# Patient Record
Sex: Female | Born: 1975
Health system: Southern US, Community
[De-identification: ages and names within clinical notes are randomized; demographics above are authoritative.]

## PROBLEM LIST (undated history)

## (undated) DIAGNOSIS — Q859 Phakomatosis, unspecified: Secondary | ICD-10-CM

## (undated) DIAGNOSIS — O09529 Supervision of elderly multigravida, unspecified trimester: Secondary | ICD-10-CM

## (undated) DIAGNOSIS — Z3490 Encounter for supervision of normal pregnancy, unspecified, unspecified trimester: Secondary | ICD-10-CM

## (undated) HISTORY — DX: Phakomatosis, unspecified: Q85.9

## (undated) HISTORY — PX: LEEP: SHX91

## (undated) HISTORY — DX: Supervision of elderly multigravida, unspecified trimester: O09.529

## (undated) HISTORY — PX: TONSILLECTOMY AND ADENOIDECTOMY: SUR1326

## (undated) HISTORY — DX: Encounter for supervision of normal pregnancy, unspecified, unspecified trimester: Z34.90

## (undated) HISTORY — PX: MOHS SURGERY: SUR867

## (undated) HISTORY — PX: OTHER SURGICAL HISTORY: SHX169

---

## 2008-12-04 ENCOUNTER — Ambulatory Visit: Payer: Self-pay | Admitting: Cardiology

## 2014-04-03 DIAGNOSIS — C4490 Unspecified malignant neoplasm of skin, unspecified: Secondary | ICD-10-CM

## 2014-04-03 HISTORY — DX: Unspecified malignant neoplasm of skin, unspecified: C44.90

## 2014-07-03 ENCOUNTER — Ambulatory Visit
Admission: RE | Admit: 2014-07-03 | Discharge: 2014-07-03 | Disposition: A | Discharge status: Home / Self Care | Payer: 59 | Source: Ambulatory Visit | Attending: Unknown Physician Specialty | Admitting: Unknown Physician Specialty

## 2014-07-03 ENCOUNTER — Other Ambulatory Visit: Payer: Self-pay | Admitting: Unknown Physician Specialty

## 2014-07-03 DIAGNOSIS — N643 Galactorrhea not associated with childbirth: Secondary | ICD-10-CM

## 2014-12-10 ENCOUNTER — Other Ambulatory Visit: Payer: Self-pay | Admitting: Family Medicine

## 2014-12-10 DIAGNOSIS — R5381 Other malaise: Secondary | ICD-10-CM

## 2015-05-03 DIAGNOSIS — N644 Mastodynia: Secondary | ICD-10-CM | POA: Diagnosis not present

## 2015-06-02 DIAGNOSIS — L820 Inflamed seborrheic keratosis: Secondary | ICD-10-CM | POA: Diagnosis not present

## 2015-06-02 DIAGNOSIS — Z08 Encounter for follow-up examination after completed treatment for malignant neoplasm: Secondary | ICD-10-CM | POA: Diagnosis not present

## 2015-06-02 DIAGNOSIS — Z85828 Personal history of other malignant neoplasm of skin: Secondary | ICD-10-CM | POA: Diagnosis not present

## 2015-06-08 DIAGNOSIS — D2312 Other benign neoplasm of skin of left eyelid, including canthus: Secondary | ICD-10-CM | POA: Diagnosis not present

## 2015-06-29 DIAGNOSIS — H524 Presbyopia: Secondary | ICD-10-CM | POA: Diagnosis not present

## 2015-08-24 DIAGNOSIS — S46912A Strain of unspecified muscle, fascia and tendon at shoulder and upper arm level, left arm, initial encounter: Secondary | ICD-10-CM | POA: Diagnosis not present

## 2015-09-08 MED FILL — PROGESTERONE 200 MG CAPSULE: 200 | 30 days supply | Qty: 30 | Fill #0

## 2015-09-10 ENCOUNTER — Encounter: Payer: Self-pay | Admitting: Adult Health

## 2015-09-10 ENCOUNTER — Ambulatory Visit (INDEPENDENT_AMBULATORY_CARE_PROVIDER_SITE_OTHER): Payer: 59 | Admitting: Adult Health

## 2015-09-10 VITALS — BP 130/70 | HR 92 | Ht 68.5 in | Wt 140.0 lb

## 2015-09-10 DIAGNOSIS — O3680X0 Pregnancy with inconclusive fetal viability, not applicable or unspecified: Secondary | ICD-10-CM

## 2015-09-10 DIAGNOSIS — Z3201 Encounter for pregnancy test, result positive: Secondary | ICD-10-CM | POA: Diagnosis not present

## 2015-09-10 DIAGNOSIS — M545 Low back pain: Secondary | ICD-10-CM

## 2015-09-10 DIAGNOSIS — R1030 Lower abdominal pain, unspecified: Secondary | ICD-10-CM

## 2015-09-10 DIAGNOSIS — Z3490 Encounter for supervision of normal pregnancy, unspecified, unspecified trimester: Secondary | ICD-10-CM

## 2015-09-10 DIAGNOSIS — O09521 Supervision of elderly multigravida, first trimester: Secondary | ICD-10-CM

## 2015-09-10 DIAGNOSIS — O09529 Supervision of elderly multigravida, unspecified trimester: Secondary | ICD-10-CM

## 2015-09-10 HISTORY — DX: Encounter for supervision of normal pregnancy, unspecified, unspecified trimester: Z34.90

## 2015-09-10 HISTORY — DX: Supervision of elderly multigravida, unspecified trimester: O09.529

## 2015-09-10 LAB — POCT URINE PREGNANCY: PREG TEST UR: POSITIVE — AB

## 2015-09-10 NOTE — Progress Notes (Signed)
Subjective:     Patient ID: Karina Jones, female   DOB: [birth year], 40 y.o.   MRN: [id number]  HPI Karina Jones is a 40 year old white female, in for UPT, she has had 3+HPT and has some pain RUQ at rib and into back that comes and goes at times. She is taking prometrium 200 mg and 81 mg ASA and prenatal vitamins by Dr Barrie Dunker in [place], but with her insurance is wants to deliver at [facility], she works in [employer] at [employer].   Review of Systems Patient denies any headaches, hearing loss, fatigue, blurred vision, shortness of breath, chest pain, problems with bowel movements, urination, or intercourse. No joint pain or mood swings.See HPI for positves.  Reviewed past medical,surgical, social and family history. Reviewed medications and allergies.     Objective:   Physical Exam BP 130/70 mmHg  Pulse 92  Ht 5' 8.5" (1.74 m)  Wt 140 lb (63.504 kg)  BMI 20.98 kg/m2  LMP 08/04/2015 UPT + about 5+ 2 weeks by LMP with EDD 05/10/16, Skin warm and dry. Neck: mid line trachea, normal thyroid, good ROM, no lymphadenopathy noted. Lungs: clear to ausculation bilaterally. Cardiovascular: regular rate and rhythm. Abdomen is soft and non tender.     Assessment:      UPT + Pregnant AMA    Plan:     Return 6/20 for dating [place] Review handout on first trimester

## 2015-09-10 NOTE — Patient Instructions (Signed)
First Trimester of Pregnancy The first trimester of pregnancy is from week 1 until the end of week 12 (months 1 through 3). A week after a sperm fertilizes an egg, the egg will implant on the wall of the uterus. This embryo will begin to develop into a baby. Genes from you and your partner are forming the baby. The female genes determine whether the baby is a boy or a girl. At 6-8 weeks, the eyes and face are formed, and the heartbeat can be seen on ultrasound. At the end of 12 weeks, all the baby's organs are formed.  Now that you are pregnant, you will want to do everything you can to have a healthy baby. Two of the most important things are to get good prenatal care and to follow your health care provider's instructions. Prenatal care is all the medical care you receive before the baby's birth. This care will help prevent, find, and treat any problems during the pregnancy and childbirth. BODY CHANGES Your body goes through many changes during pregnancy. The changes vary from woman to woman.   You may gain or lose a couple of pounds at first.  You may feel sick to your stomach (nauseous) and throw up (vomit). If the vomiting is uncontrollable, call your health care provider.  You may tire easily.  You may develop headaches that can be relieved by medicines approved by your health care provider.  You may urinate more often. Painful urination may mean you have a bladder infection.  You may develop heartburn as a result of your pregnancy.  You may develop constipation because certain hormones are causing the muscles that push waste through your intestines to slow down.  You may develop hemorrhoids or swollen, bulging veins (varicose veins).  Your breasts may begin to grow larger and become tender. Your nipples may stick out more, and the tissue that surrounds them (areola) may become darker.  Your gums may bleed and may be sensitive to brushing and flossing.  Dark spots or blotches (chloasma,  mask of pregnancy) may develop on your face. This will likely fade after the baby is born.  Your menstrual periods will stop.  You may have a loss of appetite.  You may develop cravings for certain kinds of food.  You may have changes in your emotions from day to day, such as being excited to be pregnant or being concerned that something may go wrong with the pregnancy and baby.  You may have more vivid and strange dreams.  You may have changes in your hair. These can include thickening of your hair, rapid growth, and changes in texture. Some women also have hair loss during or after pregnancy, or hair that feels dry or thin. Your hair will most likely return to normal after your baby is born. WHAT TO EXPECT AT YOUR PRENATAL VISITS During a routine prenatal visit:  You will be weighed to make sure you and the baby are growing normally.  Your blood pressure will be taken.  Your abdomen will be measured to track your baby's growth.  The fetal heartbeat will be listened to starting around week 10 or 12 of your pregnancy.  Test results from any previous visits will be discussed. Your health care provider may ask you:  How you are feeling.  If you are feeling the baby move.  If you have had any abnormal symptoms, such as leaking fluid, bleeding, severe headaches, or abdominal cramping.  If you are using any tobacco products,   including cigarettes, chewing tobacco, and electronic cigarettes.  If you have any questions. Other tests that may be performed during your first trimester include:  Blood tests to find your blood type and to check for the presence of any previous infections. They will also be used to check for low iron levels (anemia) and Rh antibodies. Later in the pregnancy, blood tests for diabetes will be done along with other tests if problems develop.  Urine tests to check for infections, diabetes, or protein in the urine.  An ultrasound to confirm the proper growth  and development of the baby.  An amniocentesis to check for possible genetic problems.  Fetal screens for spina bifida and Down syndrome.  You may need other tests to make sure you and the baby are doing well.  HIV (human immunodeficiency virus) testing. Routine prenatal testing includes screening for HIV, unless you choose not to have this test. HOME CARE INSTRUCTIONS  Medicines  Follow your health care provider's instructions regarding medicine use. Specific medicines may be either safe or unsafe to take during pregnancy.  Take your prenatal vitamins as directed.  If you develop constipation, try taking a stool softener if your health care provider approves. Diet  Eat regular, well-balanced meals. Choose a variety of foods, such as meat or vegetable-based protein, fish, milk and low-fat dairy products, vegetables, fruits, and whole grain breads and cereals. Your health care provider will help you determine the amount of weight gain that is right for you.  Avoid raw meat and uncooked cheese. These carry germs that can cause birth defects in the baby.  Eating four or five small meals rather than three large meals a day may help relieve nausea and vomiting. If you start to feel nauseous, eating a few soda crackers can be helpful. Drinking liquids between meals instead of during meals also seems to help nausea and vomiting.  If you develop constipation, eat more high-fiber foods, such as fresh vegetables or fruit and whole grains. Drink enough fluids to keep your urine clear or pale yellow. Activity and Exercise  Exercise only as directed by your health care provider. Exercising will help you:  Control your weight.  Stay in shape.  Be prepared for labor and delivery.  Experiencing pain or cramping in the lower abdomen or low back is a good sign that you should stop exercising. Check with your health care provider before continuing normal exercises.  Try to avoid standing for long  periods of time. Move your legs often if you must stand in one place for a long time.  Avoid heavy lifting.  Wear low-heeled shoes, and practice good posture.  You may continue to have sex unless your health care provider directs you otherwise. Relief of Pain or Discomfort  Wear a good support bra for breast tenderness.   Take warm sitz baths to soothe any pain or discomfort caused by hemorrhoids. Use hemorrhoid cream if your health care provider approves.   Rest with your legs elevated if you have leg cramps or low back pain.  If you develop varicose veins in your legs, wear support hose. Elevate your feet for 15 minutes, 3-4 times a day. Limit salt in your diet. Prenatal Care  Schedule your prenatal visits by the twelfth week of pregnancy. They are usually scheduled monthly at first, then more often in the last 2 months before delivery.  Write down your questions. Take them to your prenatal visits.  Keep all your prenatal visits as directed by your   health care provider. Safety  Wear your seat belt at all times when driving.  Make a list of emergency phone numbers, including numbers for family, friends, the hospital, and police and fire departments. General Tips  Ask your health care provider for a referral to a local prenatal education class. Begin classes no later than at the beginning of month 6 of your pregnancy.  Ask for help if you have counseling or nutritional needs during pregnancy. Your health care provider can offer advice or refer you to specialists for help with various needs.  Do not use hot tubs, steam rooms, or saunas.  Do not douche or use tampons or scented sanitary pads.  Do not cross your legs for long periods of time.  Avoid cat litter boxes and soil used by cats. These carry germs that can cause birth defects in the baby and possibly loss of the fetus by miscarriage or stillbirth.  Avoid all smoking, herbs, alcohol, and medicines not prescribed by  your health care provider. Chemicals in these affect the formation and growth of the baby.  Do not use any tobacco products, including cigarettes, chewing tobacco, and electronic cigarettes. If you need help quitting, ask your health care provider. You may receive counseling support and other resources to help you quit.  Schedule a dentist appointment. At home, brush your teeth with a soft toothbrush and be gentle when you floss. SEEK MEDICAL CARE IF:   You have dizziness.  You have mild pelvic cramps, pelvic pressure, or nagging pain in the abdominal area.  You have persistent nausea, vomiting, or diarrhea.  You have a bad smelling vaginal discharge.  You have pain with urination.  You notice increased swelling in your face, hands, legs, or ankles. SEEK IMMEDIATE MEDICAL CARE IF:   You have a fever.  You are leaking fluid from your vagina.  You have spotting or bleeding from your vagina.  You have severe abdominal cramping or pain.  You have rapid weight gain or loss.  You vomit blood or material that looks like coffee grounds.  You are exposed to [place] measles and have never had them.  You are exposed to fifth disease or chickenpox.  You develop a severe headache.  You have shortness of breath.  You have any kind of trauma, such as from a fall or a car accident.   This information is not intended to replace advice given to you by your health care provider. Make sure you discuss any questions you have with your health care provider.   Document Released: 03/14/2001 Document Revised: 04/10/2014 Document Reviewed: 01/28/2013 Elsevier Interactive Patient Education [employer]. Return in 6/20 for dating [place]

## 2015-09-17 ENCOUNTER — Telehealth: Payer: Self-pay | Admitting: *Deleted

## 2015-09-17 NOTE — Telephone Encounter (Signed)
Pt c/o nausea, feels like the prenatal vitamins are making her sick. Informed pt she can take two Flintstone vitamins a day since not able to tolerate. Pt states concerned because of her age and the Flintstone vitamins not having the folic acid and her age. Informed pt can see if med for nausea and vomiting would help. Offered to route message to provider for RX. Pt states she was going to try the Flinstone vitamins if no better by Monday will call our office back for Rx for the nausea.

## 2015-09-20 ENCOUNTER — Telehealth: Payer: Self-pay | Admitting: Women's Health

## 2015-09-20 ENCOUNTER — Telehealth: Payer: Self-pay | Admitting: *Deleted

## 2015-09-20 MED ORDER — DOXYLAMINE-PYRIDOXINE 10-10 MG PO TBEC: DELAYED_RELEASE_TABLET | ORAL | Status: DC

## 2015-09-20 NOTE — Telephone Encounter (Signed)
Pharmacy states someone has already answered the question they had.

## 2015-09-20 NOTE — Telephone Encounter (Signed)
Spoke with pt. Pt is requesting something for nausea. Not Zofran. Also, this is pt's first time coming here. Her youngest child is 29 and she has had 2 miscarriages before this pregnancy. Her doc in [place] put her on Progesterone. She is coming here now because she works at [employer].  Pt don't want to take this if she don't have to. Should  she have progesterone labs drawn? Please advise. Thanks!! [place]

## 2015-09-20 NOTE — Telephone Encounter (Signed)
Spoke with pt letting her know Diclegis was sent to pharmacy. Pt has already picked it up. Pt has an appt Thursday for [place] and wants to have progesterone drawn then. Pt to be added to lab schedule for Thursday. [place]

## 2015-09-23 ENCOUNTER — Other Ambulatory Visit: Payer: 59

## 2015-09-23 ENCOUNTER — Ambulatory Visit (INDEPENDENT_AMBULATORY_CARE_PROVIDER_SITE_OTHER): Payer: 59

## 2015-09-23 DIAGNOSIS — O3680X0 Pregnancy with inconclusive fetal viability, not applicable or unspecified: Secondary | ICD-10-CM

## 2015-09-23 DIAGNOSIS — Z3A08 8 weeks gestation of pregnancy: Secondary | ICD-10-CM

## 2015-09-23 DIAGNOSIS — Z331 Pregnant state, incidental: Secondary | ICD-10-CM | POA: Diagnosis not present

## 2015-09-23 NOTE — Progress Notes (Signed)
[place] 7+1 wks,single IUP, pos fht 143 bpm,normal lt ov,simple rt corpus luteal cyst 2.6 x 2 x 2.6 cm,crl 9.5 mm

## 2015-09-24 ENCOUNTER — Telehealth: Payer: Self-pay | Admitting: Adult Health

## 2015-09-24 LAB — PROGESTERONE: PROGESTERONE: 17 ng/mL

## 2015-09-24 NOTE — Telephone Encounter (Signed)
Pt aware of 17 progesterone level

## 2015-09-29 MED FILL — PROGESTERONE 200 MG CAPSULE: 200 | 30 days supply | Qty: 30 | Fill #1

## 2015-10-11 ENCOUNTER — Ambulatory Visit (INDEPENDENT_AMBULATORY_CARE_PROVIDER_SITE_OTHER): Payer: 59 | Admitting: Women's Health

## 2015-10-11 ENCOUNTER — Encounter: Payer: Self-pay | Admitting: Women's Health

## 2015-10-11 VITALS — BP 106/68 | HR 68 | Wt 144.0 lb

## 2015-10-11 DIAGNOSIS — Z36 Encounter for antenatal screening of mother: Secondary | ICD-10-CM | POA: Diagnosis not present

## 2015-10-11 DIAGNOSIS — Z3481 Encounter for supervision of other normal pregnancy, first trimester: Secondary | ICD-10-CM

## 2015-10-11 DIAGNOSIS — Z9889 Other specified postprocedural states: Secondary | ICD-10-CM

## 2015-10-11 DIAGNOSIS — Z0189 Encounter for other specified special examinations: Secondary | ICD-10-CM | POA: Diagnosis not present

## 2015-10-11 DIAGNOSIS — Z1389 Encounter for screening for other disorder: Secondary | ICD-10-CM | POA: Diagnosis not present

## 2015-10-11 DIAGNOSIS — Z369 Encounter for antenatal screening, unspecified: Secondary | ICD-10-CM

## 2015-10-11 DIAGNOSIS — Z3A10 10 weeks gestation of pregnancy: Secondary | ICD-10-CM

## 2015-10-11 DIAGNOSIS — Z8759 Personal history of other complications of pregnancy, childbirth and the puerperium: Secondary | ICD-10-CM

## 2015-10-11 DIAGNOSIS — Z3491 Encounter for supervision of normal pregnancy, unspecified, first trimester: Secondary | ICD-10-CM

## 2015-10-11 DIAGNOSIS — Z3490 Encounter for supervision of normal pregnancy, unspecified, unspecified trimester: Secondary | ICD-10-CM | POA: Insufficient documentation

## 2015-10-11 DIAGNOSIS — Z331 Pregnant state, incidental: Secondary | ICD-10-CM

## 2015-10-11 DIAGNOSIS — Z0283 Encounter for blood-alcohol and blood-drug test: Secondary | ICD-10-CM

## 2015-10-11 DIAGNOSIS — O210 Mild hyperemesis gravidarum: Secondary | ICD-10-CM

## 2015-10-11 NOTE — Progress Notes (Signed)
Subjective:  Karina Jones is a 40 y.o. [phone] Caucasian female at [redacted]w[redacted]d by LMP c/w 6wk u/ being seen today for her first obstetrical visit.  Her obstetrical history is significant for h/o SAB x3, term uncomplicated svb x2 w/ last infant weighing 9lb10oz w/o GDM or shoulder dystocia, h/o LEEP, AMA. Started care w/ Dr. Barrie Dunker in [place], but [employer] employee, so insurance making her deliver at [facility]. Dr. Barrie Dunker had started her on vaginal progesterone and baby asa daily d/t h/o 3 SABs. Had progesterone drawn here and was 17, but that was on prometrium. Pregnancy history fully reviewed. Has breast hamartoma- mammograms/us has been neg, she gets checked w/ Dr. Barrie Dunker q 43mths- has appt at beginning of Aug  Patient reports nausea- taking 2 diclegis at night- tried adding one in am but didn't noticed any difference so quit taking that one. Declines additional meds at this time. Fatigue. Denies vb, cramping, uti s/s, abnormal/malodorous vag d/c, or vulvovaginal itching/irritation.  BP 106/68 mmHg  Pulse 68  Wt 144 lb (65.318 kg)  LMP 08/04/2015  HISTORY: OB History  Gravida Para Term Preterm AB SAB TAB Ectopic Multiple Living  6 2 2  3 3    2     # Outcome Date GA Lbr Len/2nd Weight Sex Delivery Anes PTL Lv  6 Current           5 Term 04/12/07 [redacted]w[redacted]d  9 lb 10 oz (4.366 kg) F Vag-Spont EPI N Y  4 Term 11/17/02 [redacted]w[redacted]d  8 lb 6 oz (3.799 kg) M Vag-Spont EPI N Y  3 SAB           2 SAB           1 SAB              Past Medical History  Diagnosis Date  . Hamartoma ([place])     right breast  . Fibroid     breasts  . Pregnant 09/10/2015  . AMA (advanced maternal age) multigravida 35+ 09/10/2015   Past Surgical History  Procedure Laterality Date  . Tonsillectomy and adenoidectomy    . Tubes in ears    . Leep    . Mohs surgery      face  . Adnoids     Family History  Problem Relation Age of Onset  . Atrial fibrillation Mother   . Diabetes Father   . Cerebral palsy Sister   . Hypertension Brother    . Asthma Son   . Cancer Maternal Grandmother     breast  . Other Maternal Grandmother     abdominal aortic aneursym  . Stroke Maternal Grandmother   . Diabetes Maternal Grandfather   . Heart disease Maternal Grandfather   . Hypertension Maternal Grandfather     Exam   System:     General: Well developed & nourished, no acute distress   Skin: Warm & dry, normal coloration and turgor, no rashes   Neurologic: Alert & oriented, normal mood   Cardiovascular: Regular rate & rhythm   Respiratory: Effort & rate normal, LCTAB, acyanotic   Abdomen: Soft, non tender   Extremities: normal strength, tone  Thin prep pap smear neg 2016 in [place]  FHR: 150s via informal transabdominal u/s   Assessment:   Pregnancy: [id number] Patient Active Problem List   Diagnosis Date Noted  . Supervision of normal pregnancy 10/11/2015    Priority: High  . AMA (advanced maternal age) multigravida 35+ 09/10/2015    [redacted]w[redacted]d [id number] New  OB visit H/O macrosomic infant w/o GDM/shoulder dystocia H/O LEEP H/O SAB x 3 Nausea of pregnancy AMA Breast hamartoma  Plan:  Initial labs drawn Continue prenatal vitamins Problem list reviewed and updated Reviewed n/v relief measures and warning s/s to report Reviewed recommended weight gain based on pre-gravid BMI Encouraged well-balanced diet Genetic Screening discussed Integrated Screen: discussed, does think she wants to do, concerned about false pos, discussed option of cfDNA d/t being >35yo- will check w/ insurance Cystic fibrosis screening discussed declined Ultrasound discussed; fetal survey: requested Follow up in 2 weeks for 1st it/nt and visit [place] completed Discussed baby asa w/ LHE- to continue until 14wks then stop To continue prometrium until 14wks  Tawnya Crook CNM, [facility] 10/11/2015 2:39 PM

## 2015-10-11 NOTE — Patient Instructions (Addendum)
Informaseq (cell free DNA testing)  Nausea & Vomiting  Have saltine crackers or pretzels by your bed and eat a few bites before you raise your head out of bed in the morning  Eat small frequent meals throughout the day instead of large meals  Drink plenty of fluids throughout the day to stay hydrated, just don't drink a lot of fluids with your meals.  This can make your stomach fill up faster making you feel sick  Do not brush your teeth right after you eat  Products with real ginger are good for nausea, like ginger ale and ginger hard candy Make sure it says made with real ginger!  Sucking on sour candy like lemon heads is also good for nausea  If your prenatal vitamins make you nauseated, take them at night so you will sleep through the nausea  Sea Bands  If you feel like you need medicine for the nausea & vomiting please let us know  If you are unable to keep any fluids or food down please let us know   Constipation  Drink plenty of fluid, preferably water, throughout the day  Eat foods high in fiber such as fruits, vegetables, and grains  Exercise, such as walking, is a good way to keep your bowels regular  Drink warm fluids, especially warm prune juice, or decaf coffee  Eat a 1/2 cup of real oatmeal (not instant), 1/2 cup applesauce, and 1/2-1 cup warm prune juice every day  If needed, you may take Colace (docusate sodium) stool softener once or twice a day to help keep the stool soft. If you are pregnant, wait until you are out of your first trimester (12-14 weeks of pregnancy)  If you still are having problems with constipation, you may take Miralax once daily as needed to help keep your bowels regular.  If you are pregnant, wait until you are out of your first trimester (12-14 weeks of pregnancy)   First Trimester of Pregnancy The first trimester of pregnancy is from week 1 until the end of week 12 (months 1 through 3). A week after a sperm fertilizes an egg, the egg  will implant on the wall of the uterus. This embryo will begin to develop into a baby. Genes from you and your partner are forming the baby. The female genes determine whether the baby is a boy or a girl. At 6-8 weeks, the eyes and face are formed, and the heartbeat can be seen on ultrasound. At the end of 12 weeks, all the baby's organs are formed.  Now that you are pregnant, you will want to do everything you can to have a healthy baby. Two of the most important things are to get good prenatal care and to follow your health care provider's instructions. Prenatal care is all the medical care you receive before the baby's birth. This care will help prevent, find, and treat any problems during the pregnancy and childbirth. BODY CHANGES Your body goes through many changes during pregnancy. The changes vary from woman to woman.   You may gain or lose a couple of pounds at first.  You may feel sick to your stomach (nauseous) and throw up (vomit). If the vomiting is uncontrollable, call your health care provider.  You may tire easily.  You may develop headaches that can be relieved by medicines approved by your health care provider.  You may urinate more often. Painful urination may mean you have a bladder infection.  You may develop heartburn  as a result of your pregnancy.  You may develop constipation because certain hormones are causing the muscles that push waste through your intestines to slow down.  You may develop hemorrhoids or swollen, bulging veins (varicose veins).  Your breasts may begin to grow larger and become tender. Your nipples may stick out more, and the tissue that surrounds them (areola) may become darker.  Your gums may bleed and may be sensitive to brushing and flossing.  Dark spots or blotches (chloasma, mask of pregnancy) may develop on your face. This will likely fade after the baby is born.  Your menstrual periods will stop.  You may have a loss of appetite.  You  may develop cravings for certain kinds of food.  You may have changes in your emotions from day to day, such as being excited to be pregnant or being concerned that something may go wrong with the pregnancy and baby.  You may have more vivid and strange dreams.  You may have changes in your hair. These can include thickening of your hair, rapid growth, and changes in texture. Some women also have hair loss during or after pregnancy, or hair that feels dry or thin. Your hair will most likely return to normal after your baby is born. WHAT TO EXPECT AT YOUR PRENATAL VISITS During a routine prenatal visit:  You will be weighed to make sure you and the baby are growing normally.  Your blood pressure will be taken.  Your abdomen will be measured to track your baby's growth.  The fetal heartbeat will be listened to starting around week 10 or 12 of your pregnancy.  Test results from any previous visits will be discussed. Your health care provider may ask you:  How you are feeling.  If you are feeling the baby move.  If you have had any abnormal symptoms, such as leaking fluid, bleeding, severe headaches, or abdominal cramping.  If you have any questions. Other tests that may be performed during your first trimester include:  Blood tests to find your blood type and to check for the presence of any previous infections. They will also be used to check for low iron levels (anemia) and Rh antibodies. Later in the pregnancy, blood tests for diabetes will be done along with other tests if problems develop.  Urine tests to check for infections, diabetes, or protein in the urine.  An ultrasound to confirm the proper growth and development of the baby.  An amniocentesis to check for possible genetic problems.  Fetal screens for spina bifida and Down syndrome.  You may need other tests to make sure you and the baby are doing well. HOME CARE INSTRUCTIONS  Medicines  Follow your health care  provider's instructions regarding medicine use. Specific medicines may be either safe or unsafe to take during pregnancy.  Take your prenatal vitamins as directed.  If you develop constipation, try taking a stool softener if your health care provider approves. Diet  Eat regular, well-balanced meals. Choose a variety of foods, such as meat or vegetable-based protein, fish, milk and low-fat dairy products, vegetables, fruits, and whole grain breads and cereals. Your health care provider will help you determine the amount of weight gain that is right for you.  Avoid raw meat and uncooked cheese. These carry germs that can cause birth defects in the baby.  Eating four or five small meals rather than three large meals a day may help relieve nausea and vomiting. If you start to feel nauseous,  eating a few soda crackers can be helpful. Drinking liquids between meals instead of during meals also seems to help nausea and vomiting.  If you develop constipation, eat more high-fiber foods, such as fresh vegetables or fruit and whole grains. Drink enough fluids to keep your urine clear or pale yellow. Activity and Exercise  Exercise only as directed by your health care provider. Exercising will help you:  Control your weight.  Stay in shape.  Be prepared for labor and delivery.  Experiencing pain or cramping in the lower abdomen or low back is a good sign that you should stop exercising. Check with your health care provider before continuing normal exercises.  Try to avoid standing for long periods of time. Move your legs often if you must stand in one place for a long time.  Avoid heavy lifting.  Wear low-heeled shoes, and practice good posture.  You may continue to have sex unless your health care provider directs you otherwise. Relief of Pain or Discomfort  Wear a good support bra for breast tenderness.   Take warm sitz baths to soothe any pain or discomfort caused by hemorrhoids. Use  hemorrhoid cream if your health care provider approves.   Rest with your legs elevated if you have leg cramps or low back pain.  If you develop varicose veins in your legs, wear support hose. Elevate your feet for 15 minutes, 3-4 times a day. Limit salt in your diet. Prenatal Care  Schedule your prenatal visits by the twelfth week of pregnancy. They are usually scheduled monthly at first, then more often in the last 2 months before delivery.  Write down your questions. Take them to your prenatal visits.  Keep all your prenatal visits as directed by your health care provider. Safety  Wear your seat belt at all times when driving.  Make a list of emergency phone numbers, including numbers for family, friends, the hospital, and police and fire departments. General Tips  Ask your health care provider for a referral to a local prenatal education class. Begin classes no later than at the beginning of month 6 of your pregnancy.  Ask for help if you have counseling or nutritional needs during pregnancy. Your health care provider can offer advice or refer you to specialists for help with various needs.  Do not use hot tubs, steam rooms, or saunas.  Do not douche or use tampons or scented sanitary pads.  Do not cross your legs for long periods of time.  Avoid cat litter boxes and soil used by cats. These carry germs that can cause birth defects in the baby and possibly loss of the fetus by miscarriage or stillbirth.  Avoid all smoking, herbs, alcohol, and medicines not prescribed by your health care provider. Chemicals in these affect the formation and growth of the baby.  Schedule a dentist appointment. At home, brush your teeth with a soft toothbrush and be gentle when you floss. SEEK MEDICAL CARE IF:   You have dizziness.  You have mild pelvic cramps, pelvic pressure, or nagging pain in the abdominal area.  You have persistent nausea, vomiting, or diarrhea.  You have a bad  smelling vaginal discharge.  You have pain with urination.  You notice increased swelling in your face, hands, legs, or ankles. SEEK IMMEDIATE MEDICAL CARE IF:   You have a fever.  You are leaking fluid from your vagina.  You have spotting or bleeding from your vagina.  You have severe abdominal cramping or pain.  You have rapid weight gain or loss.  You vomit blood or material that looks like coffee grounds.  You are exposed to [place] measles and have never had them.  You are exposed to fifth disease or chickenpox.  You develop a severe headache.  You have shortness of breath.  You have any kind of trauma, such as from a fall or a car accident. Document Released: 03/14/2001 Document Revised: 08/04/2013 Document Reviewed: 01/28/2013 [facility] Patient Information 2015 [place], [place]. This information is not intended to replace advice given to you by your health care provider. Make sure you discuss any questions you have with your health care provider.

## 2015-10-12 LAB — PMP SCREEN PROFILE (10S), URINE
AMPHETAMINE SCRN UR: NEGATIVE ng/mL
Barbiturate Screen, Ur: NEGATIVE ng/mL
Benzodiazepine Screen, Urine: NEGATIVE ng/mL
CANNABINOIDS UR QL SCN: NEGATIVE ng/mL
CREATININE(CRT), U: 117.5 mg/dL (ref 20.0–300.0)
Cocaine(Metab.)Screen, Urine: NEGATIVE ng/mL
METHADONE SCREEN, URINE: NEGATIVE ng/mL
OXYCODONE+OXYMORPHONE UR QL SCN: NEGATIVE ng/mL
Opiate Scrn, Ur: NEGATIVE ng/mL
PCP SCRN UR: NEGATIVE ng/mL
PH UR, DRUG SCRN: 5.3 (ref 4.5–8.9)
Propoxyphene, Screen: NEGATIVE ng/mL

## 2015-10-12 LAB — CBC
Hematocrit: 38 % (ref 34.0–46.6)
Hemoglobin: 12.7 g/dL (ref 11.1–15.9)
MCH: 28.5 pg (ref 26.6–33.0)
MCHC: 33.4 g/dL (ref 31.5–35.7)
MCV: 85 fL (ref 79–97)
PLATELETS: 273 10*3/uL (ref 150–379)
RBC: 4.45 x10E6/uL (ref 3.77–5.28)
RDW: 13.8 % (ref 12.3–15.4)
WBC: 9.7 10*3/uL (ref 3.4–10.8)

## 2015-10-12 LAB — URINALYSIS, ROUTINE W REFLEX MICROSCOPIC
BILIRUBIN UA: NEGATIVE
Glucose, UA: NEGATIVE
Ketones, UA: NEGATIVE
LEUKOCYTES UA: NEGATIVE
Nitrite, UA: NEGATIVE
PH UA: 6 (ref 5.0–7.5)
PROTEIN UA: NEGATIVE
RBC UA: NEGATIVE
SPEC GRAV UA: 1.026 (ref 1.005–1.030)
UUROB: 0.2 mg/dL (ref 0.2–1.0)

## 2015-10-12 LAB — ABO/RH: Rh Factor: POSITIVE

## 2015-10-12 LAB — RUBELLA SCREEN: Rubella Antibodies, IGG: 7.34 index (ref >0.99)

## 2015-10-12 LAB — HIV ANTIBODY (ROUTINE TESTING W REFLEX): HIV SCREEN 4TH GENERATION: NONREACTIVE

## 2015-10-12 LAB — RPR: RPR Ser Ql: NONREACTIVE

## 2015-10-12 LAB — ANTIBODY SCREEN: Antibody Screen: NEGATIVE

## 2015-10-12 LAB — HEPATITIS B SURFACE ANTIGEN: HEP B S AG: NEGATIVE

## 2015-10-12 LAB — VARICELLA ZOSTER ANTIBODY, IGG: Varicella zoster IgG: 3422 index (ref >165)

## 2015-10-13 LAB — GC/CHLAMYDIA PROBE AMP
CHLAMYDIA, DNA PROBE: NEGATIVE
Neisseria gonorrhoeae by PCR: NEGATIVE

## 2015-10-13 LAB — URINE CULTURE: ORGANISM ID, BACTERIA: NO GROWTH

## 2015-10-25 ENCOUNTER — Encounter: Payer: 59 | Admitting: Obstetrics & Gynecology

## 2015-10-25 ENCOUNTER — Other Ambulatory Visit: Payer: 59

## 2015-11-03 DIAGNOSIS — N63 Unspecified lump in breast: Secondary | ICD-10-CM | POA: Diagnosis not present

## 2015-11-09 ENCOUNTER — Ambulatory Visit (INDEPENDENT_AMBULATORY_CARE_PROVIDER_SITE_OTHER): Payer: 59 | Admitting: Obstetrics & Gynecology

## 2015-11-09 ENCOUNTER — Encounter: Payer: Self-pay | Admitting: Obstetrics & Gynecology

## 2015-11-09 VITALS — BP 124/76 | HR 60 | Wt 150.0 lb

## 2015-11-09 DIAGNOSIS — Z331 Pregnant state, incidental: Secondary | ICD-10-CM

## 2015-11-09 DIAGNOSIS — Z3492 Encounter for supervision of normal pregnancy, unspecified, second trimester: Secondary | ICD-10-CM

## 2015-11-09 DIAGNOSIS — Z1389 Encounter for screening for other disorder: Secondary | ICD-10-CM

## 2015-11-09 LAB — POCT URINALYSIS DIPSTICK
GLUCOSE UA: NEGATIVE
Glucose, UA: NEGATIVE
Ketones, UA: NEGATIVE
LEUKOCYTES UA: NEGATIVE
NITRITE UA: NEGATIVE
Protein, UA: NEGATIVE
Protein, UA: NEGATIVE
RBC UA: NEGATIVE

## 2015-11-09 NOTE — Progress Notes (Signed)
[id number] [redacted]w[redacted]d Estimated Date of Delivery: 05/10/16  Blood pressure 124/76, pulse 60, weight 150 lb (68 kg), last menstrual period 08/04/2015.   BP weight and urine results all reviewed and noted.  Please refer to the obstetrical flow sheet for the fundal height and fetal heart rate documentation:  Patient reports good fetal movement, denies any bleeding and no rupture of membranes symptoms or regular contractions. Patient is without complaints. All questions were answered.  Orders Placed This Encounter  Procedures  . US OB Comp + 14 Wk  . POCT urinalysis dipstick    Plan:  Continued routine obstetrical care, 20 weeks scan at next visit  Return in about 5 weeks (around 12/14/2015) for 20 week sono, LROB.

## 2015-11-16 DIAGNOSIS — D225 Melanocytic nevi of trunk: Secondary | ICD-10-CM | POA: Diagnosis not present

## 2015-11-16 DIAGNOSIS — Z1283 Encounter for screening for malignant neoplasm of skin: Secondary | ICD-10-CM | POA: Diagnosis not present

## 2015-12-02 ENCOUNTER — Telehealth: Payer: Self-pay | Admitting: *Deleted

## 2015-12-02 NOTE — Telephone Encounter (Signed)
Pt c/o rash all over breast and chest. Pt states she is using a steroid cream that helps some.  Pt given an appt for evaluation with Dr.Eure.

## 2015-12-03 ENCOUNTER — Encounter: Payer: Self-pay | Admitting: Obstetrics & Gynecology

## 2015-12-03 ENCOUNTER — Ambulatory Visit (INDEPENDENT_AMBULATORY_CARE_PROVIDER_SITE_OTHER): Payer: 59 | Admitting: Obstetrics & Gynecology

## 2015-12-03 VITALS — BP 110/60 | HR 78 | Wt 156.0 lb

## 2015-12-03 DIAGNOSIS — R21 Rash and other nonspecific skin eruption: Secondary | ICD-10-CM | POA: Diagnosis not present

## 2015-12-03 DIAGNOSIS — Z331 Pregnant state, incidental: Secondary | ICD-10-CM | POA: Diagnosis not present

## 2015-12-03 DIAGNOSIS — Z1389 Encounter for screening for other disorder: Secondary | ICD-10-CM

## 2015-12-03 LAB — POCT URINALYSIS DIPSTICK
Blood, UA: NEGATIVE
Glucose, UA: NEGATIVE
Ketones, UA: NEGATIVE
LEUKOCYTES UA: NEGATIVE
Nitrite, UA: NEGATIVE
PROTEIN UA: NEGATIVE

## 2015-12-03 MED ORDER — TRIAMCINOLONE ACETONIDE 0.5 % EX CREA: TOPICAL_CREAM | Freq: Three times a day (TID) | CUTANEOUS | Status: DC

## 2015-12-03 NOTE — Progress Notes (Signed)
Work in [place] with new onset maculopapular rash on breast, she seems to remember it happening with her last pregnancy Not systemic and not associated with stretch marks  Exam maculopapular rash with petechiae and excoriations  FHR 157  Meds ordered this encounter  Medications  . triamcinolone cream (KENALOG) 0.5 %   Keep scheduled

## 2015-12-07 ENCOUNTER — Telehealth: Payer: Self-pay | Admitting: Obstetrics & Gynecology

## 2015-12-07 ENCOUNTER — Other Ambulatory Visit: Payer: Self-pay | Admitting: Obstetrics & Gynecology

## 2015-12-07 MED ORDER — TRIAMCINOLONE ACETONIDE 0.5 % EX OINT: 1.0000 "application " | TOPICAL_OINTMENT | Freq: Two times a day (BID) | CUTANEOUS | 2 refills | Status: DC

## 2015-12-07 NOTE — Telephone Encounter (Signed)
Pt states [facility] in [place] does not have her Rx for Kenalog. Please advise.

## 2015-12-14 ENCOUNTER — Other Ambulatory Visit: Payer: 59

## 2015-12-16 ENCOUNTER — Other Ambulatory Visit: Payer: Self-pay | Admitting: Obstetrics & Gynecology

## 2015-12-16 DIAGNOSIS — Z1389 Encounter for screening for other disorder: Secondary | ICD-10-CM

## 2015-12-17 ENCOUNTER — Ambulatory Visit (INDEPENDENT_AMBULATORY_CARE_PROVIDER_SITE_OTHER): Payer: 59

## 2015-12-17 ENCOUNTER — Ambulatory Visit (INDEPENDENT_AMBULATORY_CARE_PROVIDER_SITE_OTHER): Payer: 59 | Admitting: Obstetrics & Gynecology

## 2015-12-17 VITALS — BP 124/72 | HR 80 | Wt 160.0 lb

## 2015-12-17 DIAGNOSIS — Z36 Encounter for antenatal screening of mother: Secondary | ICD-10-CM | POA: Diagnosis not present

## 2015-12-17 DIAGNOSIS — Z1389 Encounter for screening for other disorder: Secondary | ICD-10-CM

## 2015-12-17 DIAGNOSIS — Z3492 Encounter for supervision of normal pregnancy, unspecified, second trimester: Secondary | ICD-10-CM

## 2015-12-17 DIAGNOSIS — Z3A20 20 weeks gestation of pregnancy: Secondary | ICD-10-CM | POA: Diagnosis not present

## 2015-12-17 DIAGNOSIS — Z3482 Encounter for supervision of other normal pregnancy, second trimester: Secondary | ICD-10-CM

## 2015-12-17 DIAGNOSIS — Z331 Pregnant state, incidental: Secondary | ICD-10-CM

## 2015-12-17 LAB — POCT URINALYSIS DIPSTICK
Blood, UA: NEGATIVE
Glucose, UA: NEGATIVE
KETONES UA: NEGATIVE
Leukocytes, UA: NEGATIVE
Nitrite, UA: NEGATIVE
PROTEIN UA: NEGATIVE

## 2015-12-17 NOTE — Progress Notes (Signed)
[place] [id number] wks,cephalic,post pl gr 0,cx 4cm,svp of fluid 4.1 cm,fhr 154 bpm,normal ov's bilat,efw 338 g,anatomy complete,no obvious abnormalities seen

## 2015-12-17 NOTE — Progress Notes (Signed)
[id number] [redacted]w[redacted]d Estimated Date of Delivery: 05/10/16  Blood pressure 124/72, pulse 80, weight 160 lb (72.6 kg), last menstrual period 08/04/2015.   BP weight and urine results all reviewed and noted.  Please refer to the obstetrical flow sheet for the fundal height and fetal heart rate documentation:  Patient reports good fetal movement, denies any bleeding and no rupture of membranes symptoms or regular contractions. Patient is without complaints. All questions were answered.  Orders Placed This Encounter  Procedures  . POCT urinalysis dipstick    Plan:  Continued routine obstetrical care, sonogram is normal  Return in about 4 weeks (around 01/14/2016) for LROB.

## 2016-01-17 ENCOUNTER — Ambulatory Visit (INDEPENDENT_AMBULATORY_CARE_PROVIDER_SITE_OTHER): Payer: 59 | Admitting: Women's Health

## 2016-01-17 ENCOUNTER — Encounter: Payer: Self-pay | Admitting: Women's Health

## 2016-01-17 VITALS — BP 136/76 | HR 68 | Wt 165.0 lb

## 2016-01-17 DIAGNOSIS — O09522 Supervision of elderly multigravida, second trimester: Secondary | ICD-10-CM

## 2016-01-17 DIAGNOSIS — L570 Actinic keratosis: Secondary | ICD-10-CM | POA: Diagnosis not present

## 2016-01-17 DIAGNOSIS — L821 Other seborrheic keratosis: Secondary | ICD-10-CM | POA: Diagnosis not present

## 2016-01-17 DIAGNOSIS — Z331 Pregnant state, incidental: Secondary | ICD-10-CM

## 2016-01-17 DIAGNOSIS — Z3482 Encounter for supervision of other normal pregnancy, second trimester: Secondary | ICD-10-CM

## 2016-01-17 DIAGNOSIS — Z23 Encounter for immunization: Secondary | ICD-10-CM | POA: Diagnosis not present

## 2016-01-17 DIAGNOSIS — Z1389 Encounter for screening for other disorder: Secondary | ICD-10-CM

## 2016-01-17 DIAGNOSIS — L02422 Furuncle of left axilla: Secondary | ICD-10-CM | POA: Diagnosis not present

## 2016-01-17 DIAGNOSIS — K429 Umbilical hernia without obstruction or gangrene: Secondary | ICD-10-CM | POA: Insufficient documentation

## 2016-01-17 DIAGNOSIS — X32XXXD Exposure to sunlight, subsequent encounter: Secondary | ICD-10-CM | POA: Diagnosis not present

## 2016-01-17 DIAGNOSIS — B9689 Other specified bacterial agents as the cause of diseases classified elsewhere: Secondary | ICD-10-CM | POA: Diagnosis not present

## 2016-01-17 LAB — POCT URINALYSIS DIPSTICK
GLUCOSE UA: NEGATIVE
Ketones, UA: NEGATIVE
Leukocytes, UA: NEGATIVE
NITRITE UA: NEGATIVE
Protein, UA: NEGATIVE
RBC UA: NEGATIVE

## 2016-01-17 NOTE — Patient Instructions (Signed)
You will have your sugar test next visit.  Please do not eat or drink anything after midnight the night before you come, not even water.  You will be here for at least two hours.     Call the office [phone]) or go to [facility] if:  You begin to have strong, frequent contractions  Your water breaks.  Sometimes it is a big gush of fluid, sometimes it is just a trickle that keeps getting your panties wet or running down your legs  You have vaginal bleeding.  It is normal to have a small amount of spotting if your cervix was checked.   You don't feel your baby moving like normal.  If you don't, get you something to eat and drink and lay down and focus on feeling your baby move.   If your baby is still not moving like normal, you should call the office or go to [place] of Pregnancy The second trimester is from week 13 through week 28, months 4 through 6. The second trimester is often a time when you feel your best. Your body has also adjusted to being pregnant, and you begin to feel better physically. Usually, morning sickness has lessened or quit completely, you may have more energy, and you may have an increase in appetite. The second trimester is also a time when the fetus is growing rapidly. At the end of the sixth month, the fetus is about 9 inches long and weighs about 1 pounds. You will likely begin to feel the baby move (quickening) between 18 and 20 weeks of the pregnancy. BODY CHANGES Your body goes through many changes during pregnancy. The changes vary from woman to woman.   Your weight will continue to increase. You will notice your lower abdomen bulging out.  You may begin to get stretch marks on your hips, abdomen, and breasts.  You may develop headaches that can be relieved by medicines approved by your health care provider.  You may urinate more often because the fetus is pressing on your bladder.  You may develop or continue to have  heartburn as a result of your pregnancy.  You may develop constipation because certain hormones are causing the muscles that push waste through your intestines to slow down.  You may develop hemorrhoids or swollen, bulging veins (varicose veins).  You may have back pain because of the weight gain and pregnancy hormones relaxing your joints between the bones in your pelvis and as a result of a shift in weight and the muscles that support your balance.  Your breasts will continue to grow and be tender.  Your gums may bleed and may be sensitive to brushing and flossing.  Dark spots or blotches (chloasma, mask of pregnancy) may develop on your face. This will likely fade after the baby is born.  A dark line from your belly button to the pubic area (linea nigra) may appear. This will likely fade after the baby is born.  You may have changes in your hair. These can include thickening of your hair, rapid growth, and changes in texture. Some women also have hair loss during or after pregnancy, or hair that feels dry or thin. Your hair will most likely return to normal after your baby is born. WHAT TO EXPECT AT YOUR PRENATAL VISITS During a routine prenatal visit:  You will be weighed to make sure you and the fetus are growing normally.  Your blood pressure will be taken.  Your abdomen will be measured to track your baby's growth.  The fetal heartbeat will be listened to.  Any test results from the previous visit will be discussed. Your health care provider may ask you:  How you are feeling.  If you are feeling the baby move.  If you have had any abnormal symptoms, such as leaking fluid, bleeding, severe headaches, or abdominal cramping.  If you have any questions. Other tests that may be performed during your second trimester include:  Blood tests that check for:  Low iron levels (anemia).  Gestational diabetes (between 24 and 28 weeks).  Rh antibodies.  Urine tests to check  for infections, diabetes, or protein in the urine.  An ultrasound to confirm the proper growth and development of the baby.  An amniocentesis to check for possible genetic problems.  Fetal screens for spina bifida and Down syndrome. HOME CARE INSTRUCTIONS   Avoid all smoking, herbs, alcohol, and unprescribed drugs. These chemicals affect the formation and growth of the baby.  Follow your health care provider's instructions regarding medicine use. There are medicines that are either safe or unsafe to take during pregnancy.  Exercise only as directed by your health care provider. Experiencing uterine cramps is a good sign to stop exercising.  Continue to eat regular, healthy meals.  Wear a good support bra for breast tenderness.  Do not use hot tubs, steam rooms, or saunas.  Wear your seat belt at all times when driving.  Avoid raw meat, uncooked cheese, cat litter boxes, and soil used by cats. These carry germs that can cause birth defects in the baby.  Take your prenatal vitamins.  Try taking a stool softener (if your health care provider approves) if you develop constipation. Eat more high-fiber foods, such as fresh vegetables or fruit and whole grains. Drink plenty of fluids to keep your urine clear or pale yellow.  Take warm sitz baths to soothe any pain or discomfort caused by hemorrhoids. Use hemorrhoid cream if your health care provider approves.  If you develop varicose veins, wear support hose. Elevate your feet for 15 minutes, 3-4 times a day. Limit salt in your diet.  Avoid heavy lifting, wear low heel shoes, and practice good posture.  Rest with your legs elevated if you have leg cramps or low back pain.  Visit your dentist if you have not gone yet during your pregnancy. Use a soft toothbrush to brush your teeth and be gentle when you floss.  A sexual relationship may be continued unless your health care provider directs you otherwise.  Continue to go to all your  prenatal visits as directed by your health care provider. SEEK MEDICAL CARE IF:   You have dizziness.  You have mild pelvic cramps, pelvic pressure, or nagging pain in the abdominal area.  You have persistent nausea, vomiting, or diarrhea.  You have a bad smelling vaginal discharge.  You have pain with urination. SEEK IMMEDIATE MEDICAL CARE IF:   You have a fever.  You are leaking fluid from your vagina.  You have spotting or bleeding from your vagina.  You have severe abdominal cramping or pain.  You have rapid weight gain or loss.  You have shortness of breath with chest pain.  You notice sudden or extreme swelling of your face, hands, ankles, feet, or legs.  You have not felt your baby move in over an hour.  You have severe headaches that do not go away with medicine.  You have vision changes.  Document Released: 03/14/2001 Document Revised: 03/25/2013 Document Reviewed: 05/21/2012 [employer] Patient Information 2015 [employer]. This information is not intended to replace advice given to you by your health care provider. Make sure you discuss any questions you have with your health care provider.     

## 2016-01-17 NOTE — Progress Notes (Signed)
Low-risk OB appointment [id number] [redacted]w[redacted]d Estimated Date of Delivery: 05/10/16 BP 136/76   Pulse 68   Wt 165 lb (74.8 kg)   LMP 08/04/2015   BMI 24.72 kg/m   BP, weight, and urine reviewed.  Refer to obstetrical flow sheet for FH & FHR.  Reports good fm.  Denies regular uc's, lof, vb, or uti s/s. Umbilical hernia- states she has had for years- started hurting the other day, but better now. Exam normal, to wear rolled sock or golf ball on top of umbilicus w/ belly band on top. Discussed s/s of incarceration.  Reviewed ptl s/s, fm. Plan:  Continue routine obstetrical care  F/U in 4wks for OB appointment and pn2 Flu shot today

## 2016-02-17 ENCOUNTER — Encounter: Payer: Self-pay | Admitting: Advanced Practice Midwife

## 2016-02-17 ENCOUNTER — Other Ambulatory Visit: Payer: 59

## 2016-02-17 ENCOUNTER — Ambulatory Visit (INDEPENDENT_AMBULATORY_CARE_PROVIDER_SITE_OTHER): Payer: 59 | Admitting: Advanced Practice Midwife

## 2016-02-17 VITALS — BP 126/72 | HR 70 | Wt 172.0 lb

## 2016-02-17 DIAGNOSIS — I890 Lymphedema, not elsewhere classified: Secondary | ICD-10-CM

## 2016-02-17 DIAGNOSIS — O09529 Supervision of elderly multigravida, unspecified trimester: Secondary | ICD-10-CM

## 2016-02-17 DIAGNOSIS — Z131 Encounter for screening for diabetes mellitus: Secondary | ICD-10-CM

## 2016-02-17 DIAGNOSIS — Z1389 Encounter for screening for other disorder: Secondary | ICD-10-CM

## 2016-02-17 DIAGNOSIS — Z331 Pregnant state, incidental: Secondary | ICD-10-CM

## 2016-02-17 DIAGNOSIS — Z3483 Encounter for supervision of other normal pregnancy, third trimester: Secondary | ICD-10-CM

## 2016-02-17 DIAGNOSIS — Z3A28 28 weeks gestation of pregnancy: Secondary | ICD-10-CM

## 2016-02-17 LAB — POCT URINALYSIS DIPSTICK
Blood, UA: NEGATIVE
Glucose, UA: NEGATIVE
KETONES UA: NEGATIVE
LEUKOCYTES UA: NEGATIVE
Nitrite, UA: NEGATIVE
PROTEIN UA: NEGATIVE

## 2016-02-17 NOTE — Progress Notes (Signed)
[id number] [redacted]w[redacted]d Estimated Date of Delivery: 05/10/16  Blood pressure 126/72, pulse 70, weight 172 lb (78 kg), last menstrual period 08/04/2015.   BP weight and urine results all reviewed and noted.  Please refer to the obstetrical flow sheet for the fundal height and fetal heart rate documentation:  Patient reports good fetal movement, denies any bleeding and no rupture of membranes symptoms or regular contractions. Patient has left armpit "swelling" for several months. Saw dermatologis d/t ? Folliculitis, given rx for keflex, but didn't take it. Could possibly be a lymph node chain, but most likely [place].  Coexam w/LHE, who feels like it is breast tissue. Area is tender sometimes.  All questions were answered.  Orders Placed This Encounter  Procedures  . POCT urinalysis dipstick    Plan:  Continued routine obstetrical care, PN2 today . If WBC high, will take the abx.   Return in about 3 weeks (around 03/09/2016) for [place].    ]

## 2016-02-17 NOTE — Patient Instructions (Addendum)
Third Trimester of Pregnancy The third trimester is from week 29 through week 40 (months 7 through 9). The third trimester is a time when the unborn baby (fetus) is growing rapidly. At the end of the ninth month, the fetus is about 20 inches in length and weighs 6-10 pounds. Body changes during your third trimester Your body goes through many changes during pregnancy. The changes vary from woman to woman. During the third trimester:  Your weight will continue to increase. You can expect to gain 25-35 pounds (11-16 kg) by the end of the pregnancy.  You may begin to get stretch marks on your hips, abdomen, and breasts.  You may urinate more often because the fetus is moving lower into your pelvis and pressing on your bladder.  You may develop or continue to have heartburn. This is caused by increased hormones that slow down muscles in the digestive tract.  You may develop or continue to have constipation because increased hormones slow digestion and cause the muscles that push waste through your intestines to relax.  You may develop hemorrhoids. These are swollen veins (varicose veins) in the rectum that can itch or be painful.  You may develop swollen, bulging veins (varicose veins) in your legs.  You may have increased body aches in the pelvis, back, or thighs. This is due to weight gain and increased hormones that are relaxing your joints.  You may have changes in your hair. These can include thickening of your hair, rapid growth, and changes in texture. Some women also have hair loss during or after pregnancy, or hair that feels dry or thin. Your hair will most likely return to normal after your baby is born.  Your breasts will continue to grow and they will continue to become tender. A yellow fluid (colostrum) may leak from your breasts. This is the first milk you are producing for your baby.  Your belly button may stick out.  You may notice more swelling in your hands, face, or  ankles.  You may have increased tingling or numbness in your hands, arms, and legs. The skin on your belly may also feel numb.  You may feel short of breath because of your expanding uterus.  You may have more problems sleeping. This can be caused by the size of your belly, increased need to urinate, and an increase in your body's metabolism.  You may notice the fetus "dropping," or moving lower in your abdomen.  You may have increased vaginal discharge.  Your cervix becomes thin and soft (effaced) near your due date. What to expect at prenatal visits You will have prenatal exams every 2 weeks until week 36. Then you will have weekly prenatal exams. During a routine prenatal visit:  You will be weighed to make sure you and the fetus are growing normally.  Your blood pressure will be taken.  Your abdomen will be measured to track your baby's growth.  The fetal heartbeat will be listened to.  Any test results from the previous visit will be discussed.  You may have a cervical check near your due date to see if you have effaced. At around 36 weeks, your health care provider will check your cervix. At the same time, your health care provider will also perform a test on the secretions of the vaginal tissue. This test is to determine if a type of bacteria, Group B streptococcus, is present. Your health care provider will explain this further. Your health care provider may ask you:    What your birth plan is.  How you are feeling.  If you are feeling the baby move.  If you have had any abnormal symptoms, such as leaking fluid, bleeding, severe headaches, or abdominal cramping.  If you are using any tobacco products, including cigarettes, chewing tobacco, and electronic cigarettes.  If you have any questions. Other tests or screenings that may be performed during your third trimester include:  Blood tests that check for low iron levels (anemia).  Fetal testing to check the health,  activity level, and growth of the fetus. Testing is done if you have certain medical conditions or if there are problems during the pregnancy.  Nonstress test (NST). This test checks the health of your baby to make sure there are no signs of problems, such as the baby not getting enough oxygen. During this test, a belt is placed around your belly. The baby is made to move, and its heart rate is monitored during movement. What is false labor? False labor is a condition in which you feel small, irregular tightenings of the muscles in the womb (contractions) that eventually go away. These are called Braxton Hicks contractions. Contractions may last for hours, days, or even weeks before true labor sets in. If contractions come at regular intervals, become more frequent, increase in intensity, or become painful, you should see your health care provider. What are the signs of labor?  Abdominal cramps.  Regular contractions that start at 10 minutes apart and become stronger and more frequent with time.  Contractions that start on the top of the uterus and spread down to the lower abdomen and back.  Increased pelvic pressure and dull back pain.  A watery or bloody mucus discharge that comes from the vagina.  Leaking of amniotic fluid. This is also known as your "water breaking." It could be a slow trickle or a gush. Let your doctor know if it has a color or strange odor. If you have any of these signs, call your health care provider right away, even if it is before your due date. Follow these instructions at home: Eating and drinking  Continue to eat regular, healthy meals.  Do not eat:  Raw meat or meat spreads.  Unpasteurized milk or cheese.  Unpasteurized juice.  Store-made salad.  Refrigerated smoked seafood.  Hot dogs or deli meat, unless they are piping hot.  More than 6 ounces of albacore tuna a week.  Shark, swordfish, king mackerel, or tile fish.  Store-made salads.  Raw  sprouts, such as mung bean or alfalfa sprouts.  Take prenatal vitamins as told by your health care provider.  Take 1000 mg of calcium daily as told by your health care provider.  If you develop constipation:  Take over-the-counter or prescription medicines.  Drink enough fluid to keep your urine clear or pale yellow.  Eat foods that are high in fiber, such as fresh fruits and vegetables, whole grains, and beans.  Limit foods that are high in fat and processed sugars, such as fried and sweet foods. Activity  Exercise only as directed by your health care provider. Healthy pregnant women should aim for 2 hours and 30 minutes of moderate exercise per week. If you experience any pain or discomfort while exercising, stop.  Avoid heavy lifting.  Do not exercise in extreme heat or humidity, or at high altitudes.  Wear low-heel, comfortable shoes.  Practice good posture.  Do not travel far distances unless it is absolutely necessary and only with the approval   of your health care provider.  Wear your seat belt at all times while in a car, on a bus, or on a plane.  Take frequent breaks and rest with your legs elevated if you have leg cramps or low back pain.  Do not use hot tubs, steam rooms, or saunas.  You may continue to have sex unless your health care provider tells you otherwise. Lifestyle  Do not use any products that contain nicotine or tobacco, such as cigarettes and e-cigarettes. If you need help quitting, ask your health care provider.  Do not drink alcohol.  Do not use any medicinal herbs or unprescribed drugs. These chemicals affect the formation and growth of the baby.  If you develop varicose veins:  Wear support pantyhose or compression stockings as told by your healthcare provider.  Elevate your feet for 15 minutes, 3-4 times a day.  Wear a supportive maternity bra to help with breast tenderness. General instructions  Take over-the-counter and prescription  medicines only as told by your health care provider. There are medicines that are either safe or unsafe to take during pregnancy.  Take warm sitz baths to soothe any pain or discomfort caused by hemorrhoids. Use hemorrhoid cream or witch hazel if your health care provider approves.  Avoid cat litter boxes and soil used by cats. These carry germs that can cause birth defects in the baby. If you have a cat, ask someone to clean the litter box for you.  To prepare for the arrival of your baby:  Take prenatal classes to understand, practice, and ask questions about the labor and delivery.  Make a trial run to the hospital.  Visit the hospital and tour the maternity area.  Arrange for maternity or paternity leave through employers.  Arrange for family and friends to take care of pets while you are in the hospital.  Purchase a rear-facing car seat and make sure you know how to install it in your car.  Pack your hospital bag.  Prepare the baby's nursery. Make sure to remove all pillows and stuffed animals from the baby's crib to prevent suffocation.  Visit your dentist if you have not gone during your pregnancy. Use a soft toothbrush to brush your teeth and be gentle when you floss.  Keep all prenatal follow-up visits as told by your health care provider. This is important. Contact a health care provider if:  You are unsure if you are in labor or if your water has broken.  You become dizzy.  You have mild pelvic cramps, pelvic pressure, or nagging pain in your abdominal area.  You have lower back pain.  You have persistent nausea, vomiting, or diarrhea.  You have an unusual or bad smelling vaginal discharge.  You have pain when you urinate. Get help right away if:  You have a fever.  You are leaking fluid from your vagina.  You have spotting or bleeding from your vagina.  You have severe abdominal pain or cramping.  You have rapid weight loss or weight gain.  You have  shortness of breath with chest pain.  You notice sudden or extreme swelling of your face, hands, ankles, feet, or legs.  Your baby makes fewer than 10 movements in 2 hours.  You have severe headaches that do not go away with medicine.  You have vision changes. Summary  The third trimester is from week 29 through week 40, months 7 through 9. The third trimester is a time when the unborn baby (fetus)   is growing rapidly.  During the third trimester, your discomfort may increase as you and your baby continue to gain weight. You may have abdominal, leg, and back pain, sleeping problems, and an increased need to urinate.  During the third trimester your breasts will keep growing and they will continue to become tender. A yellow fluid (colostrum) may leak from your breasts. This is the first milk you are producing for your baby.  False labor is a condition in which you feel small, irregular tightenings of the muscles in the womb (contractions) that eventually go away. These are called Braxton Hicks contractions. Contractions may last for hours, days, or even weeks before true labor sets in.  Signs of labor can include: abdominal cramps; regular contractions that start at 10 minutes apart and become stronger and more frequent with time; watery or bloody mucus discharge that comes from the vagina; increased pelvic pressure and dull back pain; and leaking of amniotic fluid. This information is not intended to replace advice given to you by your health care provider. Make sure you discuss any questions you have with your health care provider. Document Released: 03/14/2001 Document Revised: 08/26/2015 Document Reviewed: 05/21/2012 Elsevier Interactive Patient Education  2017 [place].   Blood sugar parameters <92 <185 <155

## 2016-02-18 LAB — CBC WITH DIFFERENTIAL
Basophils Absolute: 0 10*3/uL (ref 0.0–0.2)
Basos: 0 %
EOS (ABSOLUTE): 0.1 10*3/uL (ref 0.0–0.4)
Eos: 2 %
Hematocrit: 32 % — ABNORMAL LOW (ref 34.0–46.6)
Hemoglobin: 10.6 g/dL — ABNORMAL LOW (ref 11.1–15.9)
IMMATURE GRANULOCYTES: 1 %
Immature Grans (Abs): 0.1 10*3/uL (ref 0.0–0.1)
LYMPHS ABS: 1.5 10*3/uL (ref 0.7–3.1)
Lymphs: 17 %
MCH: 27.6 pg (ref 26.6–33.0)
MCHC: 33.1 g/dL (ref 31.5–35.7)
MCV: 83 fL (ref 79–97)
MONOS ABS: 0.9 10*3/uL (ref 0.1–0.9)
Monocytes: 10 %
NEUTROS PCT: 70 %
Neutrophils Absolute: 6.3 10*3/uL (ref 1.4–7.0)
RBC: 3.84 x10E6/uL (ref 3.77–5.28)
RDW: 13.2 % (ref 12.3–15.4)
WBC: 8.8 10*3/uL (ref 3.4–10.8)

## 2016-02-18 LAB — GLUCOSE TOLERANCE, 2 HOURS W/ 1HR
Glucose, 1 hour: 140 mg/dL (ref 65–179)
Glucose, 2 hour: 122 mg/dL (ref 65–152)
Glucose, Fasting: 78 mg/dL (ref 65–91)

## 2016-02-18 LAB — ANTIBODY SCREEN: ANTIBODY SCREEN: NEGATIVE

## 2016-02-18 LAB — HIV ANTIBODY (ROUTINE TESTING W REFLEX): HIV Screen 4th Generation wRfx: NONREACTIVE

## 2016-02-18 LAB — SYPHILIS: RPR W/REFLEX TO RPR TITER AND TREPONEMAL ANTIBODIES, TRADITIONAL SCREENING AND DIAGNOSIS ALGORITHM: RPR Ser Ql: NONREACTIVE

## 2016-02-21 ENCOUNTER — Encounter: Payer: Self-pay | Admitting: Advanced Practice Midwife

## 2016-03-02 ENCOUNTER — Encounter: Payer: Self-pay | Admitting: Obstetrics & Gynecology

## 2016-03-02 ENCOUNTER — Telehealth: Payer: Self-pay | Admitting: Obstetrics & Gynecology

## 2016-03-02 ENCOUNTER — Other Ambulatory Visit: Payer: Self-pay | Admitting: Obstetrics & Gynecology

## 2016-03-02 MED ORDER — PREDNISONE 10 MG PO TABS: ORAL_TABLET | ORAL | 0 refills | Status: DC

## 2016-03-02 NOTE — Telephone Encounter (Signed)
Pt states rash that was on her breast has improved with Kenalog cream but now has the rash on her abdomen and extends down her leg. Pt states the rash is the same but the the Kenalog cream has not been helping x 2 days. Please advise.

## 2016-03-09 ENCOUNTER — Encounter: Payer: Self-pay | Admitting: Obstetrics & Gynecology

## 2016-03-09 ENCOUNTER — Ambulatory Visit (INDEPENDENT_AMBULATORY_CARE_PROVIDER_SITE_OTHER): Payer: 59 | Admitting: Obstetrics & Gynecology

## 2016-03-09 VITALS — BP 130/80 | HR 80 | Wt 178.0 lb

## 2016-03-09 DIAGNOSIS — Z331 Pregnant state, incidental: Secondary | ICD-10-CM

## 2016-03-09 DIAGNOSIS — Z1389 Encounter for screening for other disorder: Secondary | ICD-10-CM

## 2016-03-09 DIAGNOSIS — Z3483 Encounter for supervision of other normal pregnancy, third trimester: Secondary | ICD-10-CM

## 2016-03-09 LAB — POCT URINALYSIS DIPSTICK
Blood, UA: NEGATIVE
Glucose, UA: NEGATIVE
KETONES UA: NEGATIVE
LEUKOCYTES UA: NEGATIVE
Nitrite, UA: NEGATIVE
Protein, UA: NEGATIVE

## 2016-03-09 NOTE — Progress Notes (Signed)
[id number] [redacted]w[redacted]d Estimated Date of Delivery: 05/10/16  Blood pressure 130/80, pulse 80, weight 178 lb (80.7 kg), last menstrual period 08/04/2015, unknown if currently breastfeeding.   BP weight and urine results all reviewed and noted.  Please refer to the obstetrical flow sheet for the fundal height and fetal heart rate documentation:  Patient reports good fetal movement, denies any bleeding and no rupture of membranes symptoms or regular contractions. Patient is without complaints. All questions were answered.  Orders Placed This Encounter  Procedures  . POCT urinalysis dipstick    Plan:  Continued routine obstetrical care, itchy maculopapular rash is improved on the prednisone, could be PPUP but atypical FH 34 will do sonogram at 35-36 weeks to evaluate EFW  Return in about 2 weeks (around 03/23/2016) for LROB.

## 2016-03-24 ENCOUNTER — Encounter: Payer: Self-pay | Admitting: Obstetrics & Gynecology

## 2016-03-24 ENCOUNTER — Ambulatory Visit (INDEPENDENT_AMBULATORY_CARE_PROVIDER_SITE_OTHER): Payer: 59 | Admitting: Obstetrics & Gynecology

## 2016-03-24 VITALS — BP 120/80 | HR 84 | Wt 181.0 lb

## 2016-03-24 DIAGNOSIS — Z3483 Encounter for supervision of other normal pregnancy, third trimester: Secondary | ICD-10-CM

## 2016-03-24 DIAGNOSIS — O99713 Diseases of the skin and subcutaneous tissue complicating pregnancy, third trimester: Secondary | ICD-10-CM

## 2016-03-24 DIAGNOSIS — Z3A34 34 weeks gestation of pregnancy: Secondary | ICD-10-CM

## 2016-03-24 DIAGNOSIS — O09529 Supervision of elderly multigravida, unspecified trimester: Secondary | ICD-10-CM

## 2016-03-24 DIAGNOSIS — Z331 Pregnant state, incidental: Secondary | ICD-10-CM

## 2016-03-24 DIAGNOSIS — L309 Dermatitis, unspecified: Secondary | ICD-10-CM

## 2016-03-24 DIAGNOSIS — Z1389 Encounter for screening for other disorder: Secondary | ICD-10-CM

## 2016-03-24 LAB — POCT URINALYSIS DIPSTICK
Glucose, UA: NEGATIVE
Ketones, UA: NEGATIVE
NITRITE UA: NEGATIVE
RBC UA: NEGATIVE

## 2016-03-24 MED ORDER — PREDNISONE 10 MG PO TABS: ORAL_TABLET | ORAL | 0 refills | Status: DC

## 2016-03-24 NOTE — Progress Notes (Signed)
[id number] [redacted]w[redacted]d Estimated Date of Delivery: 05/10/16  Blood pressure 120/80, pulse 84, weight 181 lb (82.1 kg), last menstrual period 08/04/2015.   BP weight and urine results all reviewed and noted.  Please refer to the obstetrical flow sheet for the fundal height and fetal heart rate documentation:  Patient reports good fetal movement, denies any bleeding and no rupture of membranes symptoms or regular contractions. Patient is without complaints. All questions were answered.  Orders Placed This Encounter  Procedures  . POCT urinalysis dipstick    Plan:  Continued routine obstetrical care, prednisone prn  Return in about 2 weeks (around 04/07/2016) for [place], with Dr Elonda Husky.

## 2016-04-03 NOTE — L&D Delivery Note (Signed)
Delivery Note At 10:37 AM a viable female was delivered via Vaginal, Spontaneous Delivery (Presentation: cephalic; LOA  ).  APGAR: 8, 9.   Placenta status: intact.  Cord: 3VC with the following complications: none.  Anesthesia:  Epidural Episiotomy: None Lacerations: Labial (superficial right); 2nd degree;Perineal Suture Repair: 2.0 and 4-0 vicryl Est. Blood Loss (mL):  500  Mom to postpartum.  Baby to Couplet care / Skin to Skin.  Maxon Kresse,MD 05/12/2016, 11:05 AM

## 2016-04-07 ENCOUNTER — Encounter: Payer: Self-pay | Admitting: Obstetrics & Gynecology

## 2016-04-07 ENCOUNTER — Ambulatory Visit (INDEPENDENT_AMBULATORY_CARE_PROVIDER_SITE_OTHER): Payer: 59 | Admitting: Obstetrics & Gynecology

## 2016-04-07 VITALS — BP 112/70 | HR 84 | Wt 185.0 lb

## 2016-04-07 DIAGNOSIS — Z029 Encounter for administrative examinations, unspecified: Secondary | ICD-10-CM

## 2016-04-07 DIAGNOSIS — Z331 Pregnant state, incidental: Secondary | ICD-10-CM

## 2016-04-07 DIAGNOSIS — Z3493 Encounter for supervision of normal pregnancy, unspecified, third trimester: Secondary | ICD-10-CM

## 2016-04-07 DIAGNOSIS — Z1389 Encounter for screening for other disorder: Secondary | ICD-10-CM

## 2016-04-07 DIAGNOSIS — Z3A35 35 weeks gestation of pregnancy: Secondary | ICD-10-CM

## 2016-04-07 DIAGNOSIS — Z3483 Encounter for supervision of other normal pregnancy, third trimester: Secondary | ICD-10-CM

## 2016-04-07 LAB — POCT URINALYSIS DIPSTICK
Blood, UA: NEGATIVE
GLUCOSE UA: NEGATIVE
KETONES UA: NEGATIVE
Leukocytes, UA: NEGATIVE
Nitrite, UA: NEGATIVE
Protein, UA: NEGATIVE

## 2016-04-07 NOTE — Progress Notes (Signed)
[id number] [redacted]w[redacted]d Estimated Date of Delivery: 05/10/16  Blood pressure 112/70, pulse 84, weight 185 lb (83.9 kg), last menstrual period 08/04/2015.   BP weight and urine results all reviewed and noted.  Please refer to the obstetrical flow sheet for the fundal height and fetal heart rate documentation:  Patient reports good fetal movement, denies any bleeding and no rupture of membranes symptoms or regular contractions. Patient is without complaints. All questions were answered.  Orders Placed This Encounter  Procedures  . US OB Follow Up  . POCT urinalysis dipstick    Plan:  Continued routine obstetrical care,   Return in about 1 week (around 04/14/2016) for sonogram EFW, , LROB, with Dr Elonda Husky.

## 2016-04-12 ENCOUNTER — Other Ambulatory Visit: Payer: 59

## 2016-04-14 ENCOUNTER — Other Ambulatory Visit: Payer: Self-pay | Admitting: Obstetrics & Gynecology

## 2016-04-14 DIAGNOSIS — O09299 Supervision of pregnancy with other poor reproductive or obstetric history, unspecified trimester: Secondary | ICD-10-CM

## 2016-04-17 ENCOUNTER — Ambulatory Visit (INDEPENDENT_AMBULATORY_CARE_PROVIDER_SITE_OTHER): Payer: 59 | Admitting: Women's Health

## 2016-04-17 ENCOUNTER — Ambulatory Visit (INDEPENDENT_AMBULATORY_CARE_PROVIDER_SITE_OTHER): Payer: 59

## 2016-04-17 ENCOUNTER — Encounter: Payer: Self-pay | Admitting: Women's Health

## 2016-04-17 VITALS — BP 115/76 | HR 72 | Wt 190.0 lb

## 2016-04-17 DIAGNOSIS — Z3483 Encounter for supervision of other normal pregnancy, third trimester: Secondary | ICD-10-CM | POA: Diagnosis not present

## 2016-04-17 DIAGNOSIS — O09523 Supervision of elderly multigravida, third trimester: Secondary | ICD-10-CM | POA: Diagnosis not present

## 2016-04-17 DIAGNOSIS — Z1389 Encounter for screening for other disorder: Secondary | ICD-10-CM

## 2016-04-17 DIAGNOSIS — Z3A37 37 weeks gestation of pregnancy: Secondary | ICD-10-CM | POA: Diagnosis not present

## 2016-04-17 DIAGNOSIS — O09293 Supervision of pregnancy with other poor reproductive or obstetric history, third trimester: Secondary | ICD-10-CM

## 2016-04-17 DIAGNOSIS — Z331 Pregnant state, incidental: Secondary | ICD-10-CM

## 2016-04-17 DIAGNOSIS — Z3403 Encounter for supervision of normal first pregnancy, third trimester: Secondary | ICD-10-CM

## 2016-04-17 DIAGNOSIS — O09299 Supervision of pregnancy with other poor reproductive or obstetric history, unspecified trimester: Secondary | ICD-10-CM

## 2016-04-17 LAB — POCT URINALYSIS DIPSTICK
Glucose, UA: NEGATIVE
Ketones, UA: NEGATIVE
Leukocytes, UA: NEGATIVE
Nitrite, UA: NEGATIVE

## 2016-04-17 NOTE — Progress Notes (Signed)
[place] [id number] wks,cephalic,post pl gr 3,normal ov's bilat,afi 11.5 cm,fhr 124 bpm,EFW 3189 g 65%

## 2016-04-17 NOTE — Patient Instructions (Addendum)
Call the office ([phone]) or go to [facility] if:  You begin to have strong, frequent contractions  Your water breaks.  Sometimes it is a big gush of fluid, sometimes it is just a trickle that keeps getting your panties wet or running down your legs  You have vaginal bleeding.  It is normal to have a small amount of spotting if your cervix was checked.   You don't feel your baby moving like normal.  If you don't, get you something to eat and drink and lay down and focus on feeling your baby move.  You should feel at least 10 movements in 2 hours.  If you don't, you should call the office or go to [facility].     Braxton Hicks Contractions Contractions of the uterus can occur throughout pregnancy. Contractions are not always a sign that you are in labor.  WHAT ARE BRAXTON HICKS CONTRACTIONS?  Contractions that occur before labor are called Braxton Hicks contractions, or false labor. Toward the end of pregnancy (32-34 weeks), these contractions can develop more often and may become more forceful. This is not true labor because these contractions do not result in opening (dilatation) and thinning of the cervix. They are sometimes difficult to tell apart from true labor because these contractions can be forceful and people have different pain tolerances. You should not feel embarrassed if you go to the hospital with false labor. Sometimes, the only way to tell if you are in true labor is for your health care provider to look for changes in the cervix. If there are no prenatal problems or other health problems associated with the pregnancy, it is completely safe to be sent home with false labor and await the onset of true labor. HOW CAN YOU TELL THE DIFFERENCE BETWEEN TRUE AND FALSE LABOR? False Labor   The contractions of false labor are usually shorter and not as hard as those of true labor.   The contractions are usually irregular.   The contractions are often felt in the front of  the lower abdomen and in the groin.   The contractions may go away when you walk around or change positions while lying down.   The contractions get weaker and are shorter lasting as time goes on.   The contractions do not usually become progressively stronger, regular, and closer together as with true labor.  True Labor   Contractions in true labor last 30-70 seconds, become very regular, usually become more intense, and increase in frequency.   The contractions do not go away with walking.   The discomfort is usually felt in the top of the uterus and spreads to the lower abdomen and low back.   True labor can be determined by your health care provider with an exam. This will show that the cervix is dilating and getting thinner.  WHAT TO REMEMBER  Keep up with your usual exercises and follow other instructions given by your health care provider.   Take medicines as directed by your health care provider.   Keep your regular prenatal appointments.   Eat and drink lightly if you think you are going into labor.   If Braxton Hicks contractions are making you uncomfortable:   Change your position from lying down or resting to walking, or from walking to resting.   Sit and rest in a tub of warm water.   Drink 2-3 glasses of water. Dehydration may cause these contractions.   Do slow and deep breathing several   times an hour.  WHEN SHOULD I SEEK IMMEDIATE MEDICAL CARE? Seek immediate medical care if:  Your contractions become stronger, more regular, and closer together.   You have fluid leaking or gushing from your vagina.   You have a fever.   You pass blood-tinged mucus.   You have vaginal bleeding.   You have continuous abdominal pain.   You have low back pain that you never had before.   You feel your baby's head pushing down and causing pelvic pressure.   Your baby is not moving as much as it used to.  This information is not intended to  replace advice given to you by your health care provider. Make sure you discuss any questions you have with your health care provider. Document Released: 03/20/2005 Document Revised: 07/12/2015 Document Reviewed: 12/30/2012 Elsevier Interactive Patient Education  2017 [employer]  

## 2016-04-17 NOTE — Progress Notes (Signed)
Low-risk OB appointment [id number] [redacted]w[redacted]d Estimated Date of Delivery: 05/10/16 BP 115/76   Pulse 72   Wt 190 lb (86.2 kg)   LMP 08/04/2015   BMI 28.47 kg/m   BP, weight, and urine reviewed.  Refer to obstetrical flow sheet for FH & FHR.  Reports good fm.  Denies regular uc's, lof, vb, or uti s/s. No complaints. Getting tdap 1/18 at work.  GBS, gc/ct collected SVE per request: tight 3/50/-2, vtx Reviewed today's u/s for efw: efw 65%/afi 11.5cm. Discussed labor s/s, fkc Plan:  Continue routine obstetrical care  F/U in 1wk for OB appointment

## 2016-04-19 LAB — GC/CHLAMYDIA PROBE AMP
CHLAMYDIA, DNA PROBE: NEGATIVE
NEISSERIA GONORRHOEAE BY PCR: NEGATIVE

## 2016-04-19 LAB — STREP GP B NAA: Strep Gp B NAA: NEGATIVE

## 2016-04-24 ENCOUNTER — Encounter: Payer: Self-pay | Admitting: Obstetrics & Gynecology

## 2016-04-24 ENCOUNTER — Ambulatory Visit (INDEPENDENT_AMBULATORY_CARE_PROVIDER_SITE_OTHER): Payer: 59 | Admitting: Obstetrics & Gynecology

## 2016-04-24 VITALS — BP 110/80 | HR 84 | Wt 192.4 lb

## 2016-04-24 DIAGNOSIS — Z1389 Encounter for screening for other disorder: Secondary | ICD-10-CM

## 2016-04-24 DIAGNOSIS — Z331 Pregnant state, incidental: Secondary | ICD-10-CM

## 2016-04-24 DIAGNOSIS — Z3A38 38 weeks gestation of pregnancy: Secondary | ICD-10-CM

## 2016-04-24 DIAGNOSIS — O09523 Supervision of elderly multigravida, third trimester: Secondary | ICD-10-CM

## 2016-04-24 DIAGNOSIS — Z3483 Encounter for supervision of other normal pregnancy, third trimester: Secondary | ICD-10-CM

## 2016-04-24 LAB — POCT URINALYSIS DIPSTICK
Blood, UA: NEGATIVE
Glucose, UA: NEGATIVE
KETONES UA: NEGATIVE
LEUKOCYTES UA: NEGATIVE
Nitrite, UA: NEGATIVE
Protein, UA: NEGATIVE

## 2016-04-24 NOTE — Progress Notes (Signed)
[id number] [redacted]w[redacted]d Estimated Date of Delivery: 05/10/16  Blood pressure 110/80, pulse 84, weight 192 lb 6.4 oz (87.3 kg), last menstrual period 08/04/2015.   BP weight and urine results all reviewed and noted.  Please refer to the obstetrical flow sheet for the fundal height and fetal heart rate documentation:  Patient reports good fetal movement, denies any bleeding and no rupture of membranes symptoms or regular contractions. Patient is without complaints. All questions were answered.  Orders Placed This Encounter  Procedures  . POCT urinalysis dipstick    Plan:  Continued routine obstetrical care, EFW 65%  Return in about 1 week (around 05/01/2016) for LROB.

## 2016-04-25 ENCOUNTER — Encounter: Payer: 59 | Admitting: Obstetrics & Gynecology

## 2016-05-02 ENCOUNTER — Inpatient Hospital Stay (HOSPITAL_COMMUNITY)
Admission: AD | Admit: 2016-05-02 | Discharge: 2016-05-03 | Disposition: A | Discharge status: Home / Self Care | Payer: 59 | Source: Ambulatory Visit | Attending: Obstetrics and Gynecology | Admitting: Obstetrics and Gynecology

## 2016-05-02 ENCOUNTER — Encounter (HOSPITAL_COMMUNITY): Payer: Self-pay

## 2016-05-02 ENCOUNTER — Ambulatory Visit (INDEPENDENT_AMBULATORY_CARE_PROVIDER_SITE_OTHER): Payer: 59 | Admitting: Obstetrics & Gynecology

## 2016-05-02 ENCOUNTER — Encounter: Payer: Self-pay | Admitting: Obstetrics & Gynecology

## 2016-05-02 VITALS — BP 110/80 | HR 80 | Wt 191.0 lb

## 2016-05-02 DIAGNOSIS — Z3A38 38 weeks gestation of pregnancy: Secondary | ICD-10-CM | POA: Insufficient documentation

## 2016-05-02 DIAGNOSIS — Z331 Pregnant state, incidental: Secondary | ICD-10-CM

## 2016-05-02 DIAGNOSIS — O368390 Maternal care for abnormalities of the fetal heart rate or rhythm, unspecified trimester, not applicable or unspecified: Secondary | ICD-10-CM

## 2016-05-02 DIAGNOSIS — O4693 Antepartum hemorrhage, unspecified, third trimester: Secondary | ICD-10-CM

## 2016-05-02 DIAGNOSIS — Z3483 Encounter for supervision of other normal pregnancy, third trimester: Secondary | ICD-10-CM

## 2016-05-02 DIAGNOSIS — Z1389 Encounter for screening for other disorder: Secondary | ICD-10-CM

## 2016-05-02 DIAGNOSIS — Z3A39 39 weeks gestation of pregnancy: Secondary | ICD-10-CM

## 2016-05-02 LAB — POCT URINALYSIS DIPSTICK
Blood, UA: NEGATIVE
GLUCOSE UA: NEGATIVE
LEUKOCYTES UA: NEGATIVE
NITRITE UA: NEGATIVE

## 2016-05-02 NOTE — Progress Notes (Signed)
[id number] [redacted]w[redacted]d Estimated Date of Delivery: 05/10/16  Blood pressure 110/80, pulse 80, weight 191 lb (86.6 kg), last menstrual period 08/04/2015.   BP weight and urine results all reviewed and noted.  Please refer to the obstetrical flow sheet for the fundal height and fetal heart rate documentation:  Patient reports good fetal movement, denies any bleeding and no rupture of membranes symptoms or regular contractions. Patient is without complaints. All questions were answered.  Orders Placed This Encounter  Procedures  . POCT urinalysis dipstick    Plan:  Continued routine obstetrical care,   No Follow-up on file.

## 2016-05-02 NOTE — MAU Provider Note (Signed)
Obstetric Resident MAU Note  Chief Complaint:  No chief complaint on file.   None    HPI: Karina Jones is a 41 y.o. [id number] at [redacted]w[redacted]d who presents to maternity admissions reporting vaginal bleeding. She was seen in clinic earlier today where she had a cervical exam performed and was found to be 4cm dilated. She reports afterwards expecting to have a small amount of spotting, but had bleeding that was slightly heavier and appeared to have small clots present. She called the telehealth line and was instructed to present for further evaluation.    She last had intercourse 3 days ago. No trauma or other vaginal discharge.   Intermittent contractions, she reports having some that were with increased frequency and intensity earlier today. Denies an leakage of fluid. Good fetal movement.   Pregnancy Course: Receives care at [facility] History of LEEP procedure AMA  Patient Active Problem List   Diagnosis Date Noted  . Umbilical hernia 0000000  . Supervision of normal pregnancy 10/11/2015  . History of delivery of macrosomal infant 10/11/2015  . History of loop electrical excision procedure (LEEP) 10/11/2015  . AMA (advanced maternal age) multigravida 35+ 09/10/2015    Past Medical History:  Diagnosis Date  . AMA (advanced maternal age) multigravida 35+ 09/10/2015  . Fibroid    breasts  . Hamartoma ([place])    right breast  . Pregnant 09/10/2015    OB History  Gravida Para Term Preterm AB Living  6 2 2   3 2   SAB TAB Ectopic Multiple Live Births  3       2    # Outcome Date GA Lbr Len/2nd Weight Sex Delivery Anes PTL Lv  6 Current           5 Term 04/12/07 [redacted]w[redacted]d  9 lb 10 oz (4.366 kg) F Vag-Spont EPI N LIV  4 Term 11/17/02 [redacted]w[redacted]d  8 lb 6 oz (3.799 kg) M Vag-Spont EPI N LIV  3 SAB           2 SAB           1 SAB               Past Surgical History:  Procedure Laterality Date  . adnoids    . LEEP    . MOHS SURGERY     face  . TONSILLECTOMY AND ADENOIDECTOMY    . tubes  in ears      Family History: Family History  Problem Relation Age of Onset  . Atrial fibrillation Mother   . Diabetes Father   . Cerebral palsy Sister   . Hypertension Brother   . Asthma Son   . Cancer Maternal Grandmother     breast  . Other Maternal Grandmother     abdominal aortic aneursym  . Stroke Maternal Grandmother   . Diabetes Maternal Grandfather   . Heart disease Maternal Grandfather   . Hypertension Maternal Grandfather     Social History: Social History  Substance Use Topics  . Smoking status: Never Smoker  . Smokeless tobacco: Never Used  . Alcohol use No    Allergies: No Known Allergies  Prescriptions Prior to Admission  Medication Sig Dispense Refill Last Dose  . Prenatal Vit-Fe Fumarate-FA (PRENATAL VITAMIN PO) Take by mouth daily.   Taking  . triamcinolone (KENALOG) 0.025 % cream Apply 1 application topically 2 (two) times daily.   Taking    ROS: Pertinent findings in history of present illness.  Physical Exam  Blood pressure 129/80, pulse 106, temperature 98.4 F (36.9 C), temperature source Oral, resp. rate 18, height 5\' 9"  (1.753 m), weight 194 lb (88 kg), last menstrual period 08/04/2015, SpO2 99 %. CONSTITUTIONAL: Well-developed, well-nourished female in no acute distress.  HENT:  Normocephalic, atraumatic, moist mucus membranes EYES: Conjunctivae are normal.No scleral icterus.  NECK: Normal range of motion, supple SKIN: Skin is warm and dry. No rash noted. Not diaphoretic. No erythema. No pallor. [place]: Alert and oriented to person, place, and time. No focal defects PSYCHIATRIC: Normal mood and affect. Normal behavior. Normal judgment and thought content. CARDIOVASCULAR: Normal heart rate noted, regular rhythm RESPIRATORY: No increased work of breathing, stable on room air. LCTAB. ABDOMEN: Soft, nontender, nondistended, gravid appropriate for gestational age MUSCULOSKELETAL: Normal range of motion. No edema and no tenderness. 2+ distal  pulses.  SPECULUM EXAM: NEFG, dark brown discharge present exiting the external cervical os. No bright red bleeding. Cervix does appear to be dilated visually.   Dilation: 3.5 Effacement (%): 80 Cervical Position: Middle Station: -1 Presentation: Vertex Exam by:: Dustine Bertini,CNM  FHT:  Baseline 130s , moderate variability, accelerations present, concern for late deceleration x 1 Contractions: irregular pattern   Labs: Results for orders placed or performed in visit on 05/02/16 (from the past 24 hour(s))  POCT urinalysis dipstick     Status: None   Collection Time: 05/02/16  4:08 PM  Result Value Ref Range   Color, UA     Clarity, UA     Glucose, UA neg    Bilirubin, UA     Ketones, UA small    Spec Grav, UA     Blood, UA neg    pH, UA     Protein, UA trace    Urobilinogen, UA     Nitrite, UA neg    Leukocytes, UA Negative Negative    Imaging:  No results found.  MAU Course: Patient presenting with vaginal bleeding after cervical exam in clinic earlier today where she was found to be dilated to 4cm. Speculum exam performed and showed dark red discharge exiting the external cervical os. FHT reactive, but with concern for a potential late deceleration. Plan was made to monitor for an additional 1hr and re-evaluate. At re-evaluation, patient had an unchanged cervical exam. A couple of variables were noted on the strip resulting in an ultrasound being performed to evaluate AFI, as the patient's strip was certainly reactive. BPP 8/8. Patient without any further bleeding and only bloody show noted on cervical exam.   Assessment: 1. Third trimester bleeding   2. Fetal heart rate decelerations affecting management of mother     Plan: Discharge home Labor precautions reviewed Follow up with OB provider at previously scheduled appointment  Follow-up Information    [facility] Follow up.   Specialty:  Obstetrics and Gynecology Why:  Follow-up at previously scheduled  appointment Contact information: [place] [phone]          Allergies as of 05/03/2016   No Known Allergies     Medication List    TAKE these medications   PRENATAL VITAMIN PO Take by mouth daily.   triamcinolone 0.025 % cream Commonly known as:  KENALOG Apply 1 application topically 2 (two) times daily.       Loann Quill, MD PGY-2 05/03/2016 12:54 AM  The patient was seen and examined by me also Agree with note NST reactive and reassuring UCs as listed Cervical exams as  listed in note Ready for discharge Will have her followup in clinic as scheduled Seabron Spates, CNM

## 2016-05-02 NOTE — MAU Note (Signed)
Pt reports she had a SVE in office today and this pm she had some bleeding with clots.

## 2016-05-02 NOTE — MAU Note (Signed)
Notified provider that patient is here with c/o of vaginal bleeding.

## 2016-05-03 ENCOUNTER — Inpatient Hospital Stay (HOSPITAL_COMMUNITY): Payer: 59

## 2016-05-03 DIAGNOSIS — Z3A38 38 weeks gestation of pregnancy: Secondary | ICD-10-CM | POA: Diagnosis not present

## 2016-05-03 DIAGNOSIS — Z3A39 39 weeks gestation of pregnancy: Secondary | ICD-10-CM | POA: Diagnosis not present

## 2016-05-03 DIAGNOSIS — O4693 Antepartum hemorrhage, unspecified, third trimester: Secondary | ICD-10-CM | POA: Diagnosis not present

## 2016-05-03 NOTE — Discharge Instructions (Signed)
Third Trimester of Pregnancy The third trimester is from week 29 through week 40 (months 7 through 9). The third trimester is a time when the unborn baby (fetus) is growing rapidly. At the end of the ninth month, the fetus is about 20 inches in length and weighs 6-10 pounds. Body changes during your third trimester Your body goes through many changes during pregnancy. The changes vary from woman to woman. During the third trimester:  Your weight will continue to increase. You can expect to gain 25-35 pounds (11-16 kg) by the end of the pregnancy.  You may begin to get stretch marks on your hips, abdomen, and breasts.  You may urinate more often because the fetus is moving lower into your pelvis and pressing on your bladder.  You may develop or continue to have heartburn. This is caused by increased hormones that slow down muscles in the digestive tract.  You may develop or continue to have constipation because increased hormones slow digestion and cause the muscles that push waste through your intestines to relax.  You may develop hemorrhoids. These are swollen veins (varicose veins) in the rectum that can itch or be painful.  You may develop swollen, bulging veins (varicose veins) in your legs.  You may have increased body aches in the pelvis, back, or thighs. This is due to weight gain and increased hormones that are relaxing your joints.  You may have changes in your hair. These can include thickening of your hair, rapid growth, and changes in texture. Some women also have hair loss during or after pregnancy, or hair that feels dry or thin. Your hair will most likely return to normal after your baby is born.  Your breasts will continue to grow and they will continue to become tender. A yellow fluid (colostrum) may leak from your breasts. This is the first milk you are producing for your baby.  Your belly button may stick out.  You may notice more swelling in your hands, face, or  ankles.  You may have increased tingling or numbness in your hands, arms, and legs. The skin on your belly may also feel numb.  You may feel short of breath because of your expanding uterus.  You may have more problems sleeping. This can be caused by the size of your belly, increased need to urinate, and an increase in your body's metabolism.  You may notice the fetus "dropping," or moving lower in your abdomen.  You may have increased vaginal discharge.  Your cervix becomes thin and soft (effaced) near your due date. What to expect at prenatal visits You will have prenatal exams every 2 weeks until week 36. Then you will have weekly prenatal exams. During a routine prenatal visit:  You will be weighed to make sure you and the fetus are growing normally.  Your blood pressure will be taken.  Your abdomen will be measured to track your baby's growth.  The fetal heartbeat will be listened to.  Any test results from the previous visit will be discussed.  You may have a cervical check near your due date to see if you have effaced. At around 36 weeks, your health care provider will check your cervix. At the same time, your health care provider will also perform a test on the secretions of the vaginal tissue. This test is to determine if a type of bacteria, Group B streptococcus, is present. Your health care provider will explain this further. Your health care provider may ask you:    What your birth plan is.  How you are feeling.  If you are feeling the baby move.  If you have had any abnormal symptoms, such as leaking fluid, bleeding, severe headaches, or abdominal cramping.  If you are using any tobacco products, including cigarettes, chewing tobacco, and electronic cigarettes.  If you have any questions. Other tests or screenings that may be performed during your third trimester include:  Blood tests that check for low iron levels (anemia).  Fetal testing to check the health,  activity level, and growth of the fetus. Testing is done if you have certain medical conditions or if there are problems during the pregnancy.  Nonstress test (NST). This test checks the health of your baby to make sure there are no signs of problems, such as the baby not getting enough oxygen. During this test, a belt is placed around your belly. The baby is made to move, and its heart rate is monitored during movement. What is false labor? False labor is a condition in which you feel small, irregular tightenings of the muscles in the womb (contractions) that eventually go away. These are called Braxton Hicks contractions. Contractions may last for hours, days, or even weeks before true labor sets in. If contractions come at regular intervals, become more frequent, increase in intensity, or become painful, you should see your health care provider. What are the signs of labor?  Abdominal cramps.  Regular contractions that start at 10 minutes apart and become stronger and more frequent with time.  Contractions that start on the top of the uterus and spread down to the lower abdomen and back.  Increased pelvic pressure and dull back pain.  A watery or bloody mucus discharge that comes from the vagina.  Leaking of amniotic fluid. This is also known as your "water breaking." It could be a slow trickle or a gush. Let your doctor know if it has a color or strange odor. If you have any of these signs, call your health care provider right away, even if it is before your due date. Follow these instructions at home: Eating and drinking  Continue to eat regular, healthy meals.  Do not eat:  Raw meat or meat spreads.  Unpasteurized milk or cheese.  Unpasteurized juice.  Store-made salad.  Refrigerated smoked seafood.  Hot dogs or deli meat, unless they are piping hot.  More than 6 ounces of albacore tuna a week.  Shark, swordfish, king mackerel, or tile fish.  Store-made salads.  Raw  sprouts, such as mung bean or alfalfa sprouts.  Take prenatal vitamins as told by your health care provider.  Take 1000 mg of calcium daily as told by your health care provider.  If you develop constipation:  Take over-the-counter or prescription medicines.  Drink enough fluid to keep your urine clear or pale yellow.  Eat foods that are high in fiber, such as fresh fruits and vegetables, whole grains, and beans.  Limit foods that are high in fat and processed sugars, such as fried and sweet foods. Activity  Exercise only as directed by your health care provider. Healthy pregnant women should aim for 2 hours and 30 minutes of moderate exercise per week. If you experience any pain or discomfort while exercising, stop.  Avoid heavy lifting.  Do not exercise in extreme heat or humidity, or at high altitudes.  Wear low-heel, comfortable shoes.  Practice good posture.  Do not travel far distances unless it is absolutely necessary and only with the approval   of your health care provider.  Wear your seat belt at all times while in a car, on a bus, or on a plane.  Take frequent breaks and rest with your legs elevated if you have leg cramps or low back pain.  Do not use hot tubs, steam rooms, or saunas.  You may continue to have sex unless your health care provider tells you otherwise. Lifestyle  Do not use any products that contain nicotine or tobacco, such as cigarettes and e-cigarettes. If you need help quitting, ask your health care provider.  Do not drink alcohol.  Do not use any medicinal herbs or unprescribed drugs. These chemicals affect the formation and growth of the baby.  If you develop varicose veins:  Wear support pantyhose or compression stockings as told by your healthcare provider.  Elevate your feet for 15 minutes, 3-4 times a day.  Wear a supportive maternity bra to help with breast tenderness. General instructions  Take over-the-counter and prescription  medicines only as told by your health care provider. There are medicines that are either safe or unsafe to take during pregnancy.  Take warm sitz baths to soothe any pain or discomfort caused by hemorrhoids. Use hemorrhoid cream or witch hazel if your health care provider approves.  Avoid cat litter boxes and soil used by cats. These carry germs that can cause birth defects in the baby. If you have a cat, ask someone to clean the litter box for you.  To prepare for the arrival of your baby:  Take prenatal classes to understand, practice, and ask questions about the labor and delivery.  Make a trial run to the hospital.  Visit the hospital and tour the maternity area.  Arrange for maternity or paternity leave through employers.  Arrange for family and friends to take care of pets while you are in the hospital.  Purchase a rear-facing car seat and make sure you know how to install it in your car.  Pack your hospital bag.  Prepare the baby's nursery. Make sure to remove all pillows and stuffed animals from the baby's crib to prevent suffocation.  Visit your dentist if you have not gone during your pregnancy. Use a soft toothbrush to brush your teeth and be gentle when you floss.  Keep all prenatal follow-up visits as told by your health care provider. This is important. Contact a health care provider if:  You are unsure if you are in labor or if your water has broken.  You become dizzy.  You have mild pelvic cramps, pelvic pressure, or nagging pain in your abdominal area.  You have lower back pain.  You have persistent nausea, vomiting, or diarrhea.  You have an unusual or bad smelling vaginal discharge.  You have pain when you urinate. Get help right away if:  You have a fever.  You are leaking fluid from your vagina.  You have spotting or bleeding from your vagina.  You have severe abdominal pain or cramping.  You have rapid weight loss or weight gain.  You have  shortness of breath with chest pain.  You notice sudden or extreme swelling of your face, hands, ankles, feet, or legs.  Your baby makes fewer than 10 movements in 2 hours.  You have severe headaches that do not go away with medicine.  You have vision changes. Summary  The third trimester is from week 29 through week 40, months 7 through 9. The third trimester is a time when the unborn baby (fetus)   is growing rapidly.  During the third trimester, your discomfort may increase as you and your baby continue to gain weight. You may have abdominal, leg, and back pain, sleeping problems, and an increased need to urinate.  During the third trimester your breasts will keep growing and they will continue to become tender. A yellow fluid (colostrum) may leak from your breasts. This is the first milk you are producing for your baby.  False labor is a condition in which you feel small, irregular tightenings of the muscles in the womb (contractions) that eventually go away. These are called Braxton Hicks contractions. Contractions may last for hours, days, or even weeks before true labor sets in.  Signs of labor can include: abdominal cramps; regular contractions that start at 10 minutes apart and become stronger and more frequent with time; watery or bloody mucus discharge that comes from the vagina; increased pelvic pressure and dull back pain; and leaking of amniotic fluid. This information is not intended to replace advice given to you by your health care provider. Make sure you discuss any questions you have with your health care provider. Document Released: 03/14/2001 Document Revised: 08/26/2015 Document Reviewed: 05/21/2012 Elsevier Interactive Patient Education  2017 [employer]  

## 2016-05-10 ENCOUNTER — Encounter: Payer: Self-pay | Admitting: Women's Health

## 2016-05-10 ENCOUNTER — Ambulatory Visit (INDEPENDENT_AMBULATORY_CARE_PROVIDER_SITE_OTHER): Payer: 59 | Admitting: Women's Health

## 2016-05-10 ENCOUNTER — Telehealth (HOSPITAL_COMMUNITY): Payer: Self-pay | Admitting: *Deleted

## 2016-05-10 VITALS — BP 124/72 | HR 72 | Wt 195.0 lb

## 2016-05-10 DIAGNOSIS — Z3A40 40 weeks gestation of pregnancy: Secondary | ICD-10-CM

## 2016-05-10 DIAGNOSIS — Z331 Pregnant state, incidental: Secondary | ICD-10-CM

## 2016-05-10 DIAGNOSIS — O480 Post-term pregnancy: Secondary | ICD-10-CM

## 2016-05-10 DIAGNOSIS — Z8759 Personal history of other complications of pregnancy, childbirth and the puerperium: Secondary | ICD-10-CM

## 2016-05-10 DIAGNOSIS — Z3483 Encounter for supervision of other normal pregnancy, third trimester: Secondary | ICD-10-CM

## 2016-05-10 DIAGNOSIS — Z1389 Encounter for screening for other disorder: Secondary | ICD-10-CM

## 2016-05-10 LAB — POCT URINALYSIS DIPSTICK
Blood, UA: NEGATIVE
Glucose, UA: NEGATIVE
KETONES UA: NEGATIVE
LEUKOCYTES UA: NEGATIVE
Nitrite, UA: NEGATIVE

## 2016-05-10 NOTE — Telephone Encounter (Signed)
Preadmission screen  

## 2016-05-10 NOTE — Patient Instructions (Signed)
Call the office ([phone]) or go to [facility] if:  You begin to have strong, frequent contractions  Your water breaks.  Sometimes it is a big gush of fluid, sometimes it is just a trickle that keeps getting your panties wet or running down your legs  You have vaginal bleeding.  It is normal to have a small amount of spotting if your cervix was checked.   You don't feel your baby moving like normal.  If you don't, get you something to eat and drink and lay down and focus on feeling your baby move.  You should feel at least 10 movements in 2 hours.  If you don't, you should call the office or go to [facility].     Braxton Hicks Contractions Contractions of the uterus can occur throughout pregnancy. Contractions are not always a sign that you are in labor.  WHAT ARE BRAXTON HICKS CONTRACTIONS?  Contractions that occur before labor are called Braxton Hicks contractions, or false labor. Toward the end of pregnancy (32-34 weeks), these contractions can develop more often and may become more forceful. This is not true labor because these contractions do not result in opening (dilatation) and thinning of the cervix. They are sometimes difficult to tell apart from true labor because these contractions can be forceful and people have different pain tolerances. You should not feel embarrassed if you go to the hospital with false labor. Sometimes, the only way to tell if you are in true labor is for your health care provider to look for changes in the cervix. If there are no prenatal problems or other health problems associated with the pregnancy, it is completely safe to be sent home with false labor and await the onset of true labor. HOW CAN YOU TELL THE DIFFERENCE BETWEEN TRUE AND FALSE LABOR? False Labor   The contractions of false labor are usually shorter and not as hard as those of true labor.   The contractions are usually irregular.   The contractions are often felt in the front of  the lower abdomen and in the groin.   The contractions may go away when you walk around or change positions while lying down.   The contractions get weaker and are shorter lasting as time goes on.   The contractions do not usually become progressively stronger, regular, and closer together as with true labor.  True Labor   Contractions in true labor last 30-70 seconds, become very regular, usually become more intense, and increase in frequency.   The contractions do not go away with walking.   The discomfort is usually felt in the top of the uterus and spreads to the lower abdomen and low back.   True labor can be determined by your health care provider with an exam. This will show that the cervix is dilating and getting thinner.  WHAT TO REMEMBER  Keep up with your usual exercises and follow other instructions given by your health care provider.   Take medicines as directed by your health care provider.   Keep your regular prenatal appointments.   Eat and drink lightly if you think you are going into labor.   If Braxton Hicks contractions are making you uncomfortable:   Change your position from lying down or resting to walking, or from walking to resting.   Sit and rest in a tub of warm water.   Drink 2-3 glasses of water. Dehydration may cause these contractions.   Do slow and deep breathing several   times an hour.  WHEN SHOULD I SEEK IMMEDIATE MEDICAL CARE? Seek immediate medical care if:  Your contractions become stronger, more regular, and closer together.   You have fluid leaking or gushing from your vagina.   You have a fever.   You pass blood-tinged mucus.   You have vaginal bleeding.   You have continuous abdominal pain.   You have low back pain that you never had before.   You feel your baby's head pushing down and causing pelvic pressure.   Your baby is not moving as much as it used to.  This information is not intended to  replace advice given to you by your health care provider. Make sure you discuss any questions you have with your health care provider. Document Released: 03/20/2005 Document Revised: 07/12/2015 Document Reviewed: 12/30/2012 Elsevier Interactive Patient Education  2017 [employer] Inc.  

## 2016-05-10 NOTE — Progress Notes (Signed)
Low-risk OB appointment [id number] [redacted]w[redacted]d Estimated Date of Delivery: 05/10/16 BP 124/72   Pulse 72   Wt 195 lb (88.5 kg)   LMP 08/04/2015   BMI 28.80 kg/m   BP, weight, and urine reviewed.  Refer to obstetrical flow sheet for FH & FHR.  Reports good fm.  Denies regular uc's, lof, vb, or uti s/s. No complaints. Went to [facility] 1/30 after being checked in office d/t heavier bleeding than just spotting w/ small clots. Had a variable while on efm, so had bpp which was 8/8, and bleeding stopped- so kind of concerns her.  Wants membranes swept SVE: 4/50/-2, vtx, discussed r/b of membrane sweeping- pt decided to proceed, so membranes swept, now 5/50/-2 Offered NST for reassurance, reactive/Cat 1  Reviewed labor s/s, fkc. Plan:  Continue routine obstetrical care  F/U in 2d for OB appointment and bpp d/t postdates/ama/reassurance IOL scheduled for 2/15 @ 0730 if needed for postdates (2/14 not available)

## 2016-05-12 ENCOUNTER — Inpatient Hospital Stay (HOSPITAL_COMMUNITY): Payer: 59 | Admitting: Anesthesiology

## 2016-05-12 ENCOUNTER — Encounter (HOSPITAL_COMMUNITY): Payer: Self-pay

## 2016-05-12 ENCOUNTER — Inpatient Hospital Stay (HOSPITAL_COMMUNITY)
Admission: AD | Admit: 2016-05-12 | Discharge: 2016-05-14 | DRG: 775 | Disposition: A | Discharge status: Home / Self Care | Payer: 59 | Source: Ambulatory Visit | Attending: Obstetrics & Gynecology | Admitting: Obstetrics & Gynecology

## 2016-05-12 ENCOUNTER — Other Ambulatory Visit: Payer: 59 | Admitting: Obstetrics and Gynecology

## 2016-05-12 DIAGNOSIS — O360190 Maternal care for anti-D [Rh] antibodies, unspecified trimester, not applicable or unspecified: Secondary | ICD-10-CM | POA: Diagnosis not present

## 2016-05-12 DIAGNOSIS — Z3403 Encounter for supervision of normal first pregnancy, third trimester: Secondary | ICD-10-CM

## 2016-05-12 DIAGNOSIS — Z3493 Encounter for supervision of normal pregnancy, unspecified, third trimester: Secondary | ICD-10-CM | POA: Diagnosis present

## 2016-05-12 DIAGNOSIS — Z3A40 40 weeks gestation of pregnancy: Secondary | ICD-10-CM | POA: Diagnosis not present

## 2016-05-12 LAB — TYPE AND SCREEN
ABO/RH(D): A POS
Antibody Screen: NEGATIVE

## 2016-05-12 LAB — CBC
HEMATOCRIT: 33.5 % — AB (ref 36.0–46.0)
HEMOGLOBIN: 10.7 g/dL — AB (ref 12.0–15.0)
MCH: 26 pg (ref 26.0–34.0)
MCHC: 31.9 g/dL (ref 30.0–36.0)
MCV: 81.3 fL (ref 78.0–100.0)
Platelets: 224 10*3/uL (ref 150–400)
RBC: 4.12 MIL/uL (ref 3.87–5.11)
RDW: 15.5 % (ref 11.5–15.5)
WBC: 10.9 10*3/uL — ABNORMAL HIGH (ref 4.0–10.5)

## 2016-05-12 LAB — POCT FERN TEST: POCT FERN TEST: POSITIVE

## 2016-05-12 LAB — ABO/RH: ABO/RH(D): A POS

## 2016-05-12 MED ORDER — EPHEDRINE 5 MG/ML INJ: 10.0000 mg | INTRAVENOUS | Status: DC | PRN

## 2016-05-12 MED ORDER — FLEET ENEMA 7-19 GM/118ML RE ENEM: 1.0000 | ENEMA | RECTAL | Status: DC | PRN

## 2016-05-12 MED ORDER — LACTATED RINGERS IV SOLN: INTRAVENOUS | Status: DC

## 2016-05-12 MED ORDER — DIBUCAINE 1 % RE OINT: 1.0000 "application " | TOPICAL_OINTMENT | RECTAL | Status: DC | PRN

## 2016-05-12 MED ORDER — BENZOCAINE-MENTHOL 20-0.5 % EX AERO
1.0000 "application " | INHALATION_SPRAY | CUTANEOUS | Status: DC | PRN
  Administered 2016-05-12: 1 via TOPICAL
  Filled 2016-05-12: qty 56

## 2016-05-12 MED ORDER — PHENYLEPHRINE 40 MCG/ML (10ML) SYRINGE FOR IV PUSH (FOR BLOOD PRESSURE SUPPORT)
PREFILLED_SYRINGE | INTRAVENOUS | Status: AC
  Filled 2016-05-12: qty 10

## 2016-05-12 MED ORDER — LACTATED RINGERS IV SOLN: 500.0000 mL | INTRAVENOUS | Status: DC | PRN

## 2016-05-12 MED ORDER — DOCUSATE SODIUM 100 MG PO CAPS
100.0000 mg | ORAL_CAPSULE | Freq: Two times a day (BID) | ORAL | Status: DC
  Administered 2016-05-12 – 2016-05-13 (×2): 100 mg via ORAL
  Filled 2016-05-12 (×3): qty 1

## 2016-05-12 MED ORDER — OXYCODONE-ACETAMINOPHEN 5-325 MG PO TABS: 2.0000 | ORAL_TABLET | ORAL | Status: DC | PRN

## 2016-05-12 MED ORDER — TETANUS-DIPHTH-ACELL PERTUSSIS 5-2.5-18.5 LF-MCG/0.5 IM SUSP: 0.5000 mL | Freq: Once | INTRAMUSCULAR | Status: DC

## 2016-05-12 MED ORDER — SOD CITRATE-CITRIC ACID 500-334 MG/5ML PO SOLN: 30.0000 mL | ORAL | Status: DC | PRN

## 2016-05-12 MED ORDER — OXYCODONE-ACETAMINOPHEN 5-325 MG PO TABS: 1.0000 | ORAL_TABLET | ORAL | Status: DC | PRN

## 2016-05-12 MED ORDER — OXYTOCIN 40 UNITS IN LACTATED RINGERS INFUSION - SIMPLE MED
2.5000 [IU]/h | INTRAVENOUS | Status: DC
  Filled 2016-05-12: qty 1000

## 2016-05-12 MED ORDER — ACETAMINOPHEN 325 MG PO TABS: 650.0000 mg | ORAL_TABLET | ORAL | Status: DC | PRN

## 2016-05-12 MED ORDER — SENNOSIDES-DOCUSATE SODIUM 8.6-50 MG PO TABS
2.0000 | ORAL_TABLET | ORAL | Status: DC
  Administered 2016-05-12 – 2016-05-14 (×2): 2 via ORAL
  Filled 2016-05-12 (×2): qty 2

## 2016-05-12 MED ORDER — ONDANSETRON HCL 4 MG/2ML IJ SOLN: 4.0000 mg | Freq: Four times a day (QID) | INTRAMUSCULAR | Status: DC | PRN

## 2016-05-12 MED ORDER — LIDOCAINE HCL (PF) 1 % IJ SOLN
INTRAMUSCULAR | Status: DC | PRN
  Administered 2016-05-12: 5 mL via EPIDURAL
  Administered 2016-05-12: 4 mL via EPIDURAL

## 2016-05-12 MED ORDER — LACTATED RINGERS IV SOLN: 500.0000 mL | Freq: Once | INTRAVENOUS | Status: DC

## 2016-05-12 MED ORDER — ONDANSETRON HCL 4 MG/2ML IJ SOLN: 4.0000 mg | INTRAMUSCULAR | Status: DC | PRN

## 2016-05-12 MED ORDER — FENTANYL 2.5 MCG/ML BUPIVACAINE 1/10 % EPIDURAL INFUSION (WH - ANES)
14.0000 mL/h | INTRAMUSCULAR | Status: DC | PRN
  Administered 2016-05-12: 14.5 mL/h via EPIDURAL
  Filled 2016-05-12: qty 100

## 2016-05-12 MED ORDER — WITCH HAZEL-GLYCERIN EX PADS: 1.0000 "application " | MEDICATED_PAD | CUTANEOUS | Status: DC | PRN

## 2016-05-12 MED ORDER — PHENYLEPHRINE 40 MCG/ML (10ML) SYRINGE FOR IV PUSH (FOR BLOOD PRESSURE SUPPORT): 80.0000 ug | PREFILLED_SYRINGE | INTRAVENOUS | Status: DC | PRN

## 2016-05-12 MED ORDER — ZOLPIDEM TARTRATE 5 MG PO TABS: 5.0000 mg | ORAL_TABLET | Freq: Every evening | ORAL | Status: DC | PRN

## 2016-05-12 MED ORDER — PRENATAL MULTIVITAMIN CH
1.0000 | ORAL_TABLET | Freq: Every day | ORAL | Status: DC
  Administered 2016-05-13: 1 via ORAL
  Filled 2016-05-12: qty 1

## 2016-05-12 MED ORDER — IBUPROFEN 600 MG PO TABS
600.0000 mg | ORAL_TABLET | Freq: Four times a day (QID) | ORAL | Status: DC
  Administered 2016-05-12 – 2016-05-14 (×7): 600 mg via ORAL
  Filled 2016-05-12 (×7): qty 1

## 2016-05-12 MED ORDER — COCONUT OIL OIL: 1.0000 "application " | TOPICAL_OIL | Status: DC | PRN

## 2016-05-12 MED ORDER — MEASLES, MUMPS & RUBELLA VAC ~~LOC~~ INJ: 0.5000 mL | INJECTION | Freq: Once | SUBCUTANEOUS | Status: DC

## 2016-05-12 MED ORDER — ONDANSETRON HCL 4 MG PO TABS: 4.0000 mg | ORAL_TABLET | ORAL | Status: DC | PRN

## 2016-05-12 MED ORDER — LIDOCAINE HCL (PF) 1 % IJ SOLN
30.0000 mL | INTRAMUSCULAR | Status: DC | PRN
  Filled 2016-05-12: qty 30

## 2016-05-12 MED ORDER — SIMETHICONE 80 MG PO CHEW: 80.0000 mg | CHEWABLE_TABLET | ORAL | Status: DC | PRN

## 2016-05-12 MED ORDER — DIPHENHYDRAMINE HCL 50 MG/ML IJ SOLN: 12.5000 mg | INTRAMUSCULAR | Status: DC | PRN

## 2016-05-12 MED ORDER — OXYTOCIN BOLUS FROM INFUSION
500.0000 mL | Freq: Once | INTRAVENOUS | Status: AC
  Administered 2016-05-12: 500 mL via INTRAVENOUS

## 2016-05-12 MED ORDER — DIPHENHYDRAMINE HCL 25 MG PO CAPS: 25.0000 mg | ORAL_CAPSULE | Freq: Four times a day (QID) | ORAL | Status: DC | PRN

## 2016-05-12 NOTE — Anesthesia Postprocedure Evaluation (Signed)
Anesthesia Post Note  Patient: Karina Jones  Procedure(s) Performed: * No procedures listed *  Patient location during evaluation: Mother Baby Anesthesia Type: Epidural Level of consciousness: awake and alert Pain management: pain level controlled Vital Signs Assessment: post-procedure vital signs reviewed and stable Respiratory status: spontaneous breathing, nonlabored ventilation and respiratory function stable Cardiovascular status: stable Postop Assessment: no headache, no backache and epidural receding Anesthetic complications: no        Last Vitals:  Vitals:   05/12/16 1403 05/12/16 1529  BP: 116/72   Pulse: 89   Resp: 18   Temp: 37.3 C 37.2 C    Last Pain:  Vitals:   05/12/16 1552  TempSrc:   PainSc: 0-No pain   Pain Goal:                 [employer]

## 2016-05-12 NOTE — Anesthesia Preprocedure Evaluation (Signed)
Anesthesia Evaluation  Patient identified by MRN, date of birth, ID band Patient awake    Reviewed: Allergy & Precautions, Patient's Chart, lab work & pertinent test results  Airway Mallampati: II  TM Distance: >3 FB Neck ROM: Full    Dental no notable dental hx. (+) Teeth Intact   Pulmonary neg pulmonary ROS,    Pulmonary exam normal breath sounds clear to auscultation       Cardiovascular negative cardio ROS Normal cardiovascular exam Rhythm:Regular Rate:Normal     Neuro/Psych negative neurological ROS  negative psych ROS   GI/Hepatic negative GI ROS, Neg liver ROS,   Endo/Other  negative endocrine ROS  Renal/GU negative Renal ROS  negative genitourinary   Musculoskeletal negative musculoskeletal ROS (+)   Abdominal (+) - obese,   Peds  Hematology  (+) anemia ,   Anesthesia Other Findings   Reproductive/Obstetrics (+) Pregnancy AMA                             Anesthesia Physical Anesthesia Plan  ASA: II  Anesthesia Plan: Epidural   Post-op Pain Management:    Induction:   Airway Management Planned: Natural Airway  Additional Equipment:   Intra-op Plan:   Post-operative Plan:   Informed Consent: I have reviewed the patients History and Physical, chart, labs and discussed the procedure including the risks, benefits and alternatives for the proposed anesthesia with the patient or authorized representative who has indicated his/her understanding and acceptance.     Plan Discussed with: Anesthesiologist  Anesthesia Plan Comments:         Anesthesia Quick Evaluation

## 2016-05-12 NOTE — Anesthesia Procedure Notes (Signed)
Epidural Patient location during procedure: OB Start time: 05/12/2016 6:06 AM  Staffing Anesthesiologist: Josephine Igo Performed: anesthesiologist   Preanesthetic Checklist Completed: patient identified, site marked, surgical consent, pre-op evaluation, timeout performed, IV checked, risks and benefits discussed and monitors and equipment checked  Epidural Patient position: sitting Prep: site prepped and draped and DuraPrep Patient monitoring: continuous pulse ox and blood pressure Approach: midline Location: L3-L4 Injection technique: LOR air  Needle:  Needle type: Tuohy  Needle gauge: 17 G Needle length: 9 cm and 9 Needle insertion depth: 5 cm cm Catheter type: closed end flexible Catheter size: 19 Gauge Catheter at skin depth: 10 cm Test dose: negative and Other  Assessment Events: blood not aspirated, injection not painful, no injection resistance, negative IV test and no paresthesia  Additional Notes Patient identified. Risks and benefits discussed including failed block, incomplete  Pain control, post dural puncture headache, nerve damage, paralysis, blood pressure Changes, nausea, vomiting, reactions to medications-both toxic and allergic and post Partum back pain. All questions were answered. Patient expressed understanding and wished to proceed. Sterile technique was used throughout procedure. Epidural site was Dressed with sterile barrier dressing. No paresthesias, signs of intravascular injection Or signs of intrathecal spread were encountered.  Patient was more comfortable after the epidural was dosed. Please see RN's note for documentation of vital signs and FHR which are stable.

## 2016-05-12 NOTE — MAU Note (Signed)
Pt presents with contractions that started around 0100 and are every 5 mins for last hour and half. States that she thinks water broke in parking lot ~0415. +FM. Cervix was 5cm on last exam.

## 2016-05-12 NOTE — Progress Notes (Signed)
Evaluated patient for progress of labor. She is comfortable with epidural placed. FHR baseline 140s, mod variability, pos accels, no decels, contractions q2-50min.  Last cervical check 6.5cm, 80/90% effacement and -1 station.  Will hold on augmentation for now. Anticipate SVD.

## 2016-05-12 NOTE — H&P (Signed)
Karina Jones is a 41 y.o. female [phone] with IUP at [redacted]w[redacted]d presenting for contractions and ROM when walking into MAU. Pt states she has been having regular, every 5 minutes contractions, associated with none vaginal bleeding for 3 hours..  Membranes are ruptured, clear fluid, with active fetal movement.   [facility] at [facility] 10 wks  Prenatal History/Complications: AMA  Past Medical History: Past Medical History:  Diagnosis Date  . AMA (advanced maternal age) multigravida 35+ 09/10/2015  . [place]artoma ([place])    right breast  . Pregnant 09/10/2015    Past Surgical History: Past Surgical History:  Procedure Laterality Date  . adnoids    . LEEP    . MOHS SURGERY     face  . TONSILLECTOMY AND ADENOIDECTOMY    . tubes in ears      Obstetrical History: OB History    Gravida Para Term Preterm AB Living   6 2 2   3 2    SAB TAB Ectopic Multiple Live Births   3       2       Social History: Social History   Social History  . Marital status: Married    Spouse name: N/A  . Number of children: N/A  . Years of education: N/A   Social History Main Topics  . Smoking status: Never Smoker  . Smokeless tobacco: Never Used  . Alcohol use No  . Drug use: No  . Sexual activity: Yes    Birth control/ protection: None   Other Topics Concern  . None   Social History Narrative  . None    Family History: Family History  Problem Relation Age of Onset  . Atrial fibrillation Mother   . Diabetes Father   . Cerebral palsy Sister   . Hypertension Brother   . Asthma Son   . Cancer Maternal Grandmother     breast  . Other Maternal Grandmother     abdominal aortic aneursym  . Stroke Maternal Grandmother   . Diabetes Maternal Grandfather   . Heart disease Maternal Grandfather   . Hypertension Maternal Grandfather     Allergies: No Known Allergies  Prescriptions Prior to Admission  Medication Sig Dispense Refill Last Dose  . Prenatal Vit-Fe Fumarate-FA (PRENATAL  VITAMIN PO) Take by mouth daily.   Taking  . triamcinolone (KENALOG) 0.025 % cream Apply 1 application topically 2 (two) times daily.   Taking        Review of Systems   Constitutional: Negative for fever and chills Eyes: Negative for visual disturbances Respiratory: Negative for shortness of breath, dyspnea Cardiovascular: Negative for chest pain or palpitations  Gastrointestinal: Negative for vomiting, diarrhea and constipation.  POSITIVE for abdominal pain (contractions) Genitourinary: Negative for dysuria and urgency Musculoskeletal: Negative for back pain, joint pain, myalgias  Neurological: Negative for dizziness and headaches      Blood pressure 141/91, pulse 102, temperature 97.9 F (36.6 C), resp. rate 19, last menstrual period 08/04/2015. General appearance: alert, cooperative and no distress Lungs: clear to auscultation bilaterally Heart: regular rate and rhythm Abdomen: soft, non-tender; bowel sounds normal Extremities: Homans sign is negative, no sign of DVT DTR's 2+ Presentation: cephalic Fetal monitoring  Baseline: 135 bpm, Variability: Good {> 6 bpm), Accelerations: Reactive and Decelerations: Absent Uterine activity  3-5 Dilation: 4.5 Effacement (%): 80 Station: -2 Exam by:: Maryagnes Amos RN   Prenatal labs: ABO, Rh: A/Positive/-- (07/10 1458) Antibody: Negative (11/16 1000) Rubella: !Error! RPR: Non Reactive (11/16 1000)  HBsAg: Negative (07/10 1458)  HIV: Non Reactive (11/16 1000)  GBS: Negative (01/15 1428)    [place]  Initiated Care at  9wks  FOB [place]  Dating By LMP c/w 6wk u/s  Pap 2016 in [place], normal  GC/CT Initial:  -/-              36+wks:  -/-  Genetic Screen NT/IT/AFP: declined  CF screen declined  Anatomic [place] Normal female 'Riley'  Flu vaccine 01/17/16   Tdap Recommended ~ 28wks  Glucose Screen  2 hr  78/140/122  GBS neg  Feed Preference breast  Contraception vasectomy  Circumcision n/a  Childbirth  Classes Healthy Pregnancy classes  Pediatrician Dr. Nadara Mustard- [facility]      Results for orders placed or performed during the hospital encounter of 05/12/16 (from the past 24 hour(s))  Fern Test   Collection Time: 05/12/16  4:40 AM  Result Value Ref Range   POCT Fern Test Positive = ruptured amniotic membanes     Assessment: Karina Jones is a 41 y.o. [id number] with an IUP at [redacted]w[redacted]d presenting for ROM/early labor  Plan: #Labor: expectant management #Pain:  Per request #FWB Cat 1 #ID: GBS: neg  #MOF:  brest #MOC: vasectomy #Circ: na   CRESENZO-DISHMAN,Shia Eber 05/12/2016, 5:52 AM

## 2016-05-12 NOTE — Anesthesia Pain Management Evaluation Note (Signed)
  CRNA Pain Management Visit Note  Patient: Karina Jones, 41 y.o., female  "Hello I am a member of the anesthesia team at [facility]. We have an anesthesia team available at all times to provide care throughout the hospital, including epidural management and anesthesia for C-section. I don't know your plan for the delivery whether it a natural birth, water birth, IV sedation, nitrous supplementation, doula or epidural, but we want to meet your pain goals."   1.Was your pain managed to your expectations on prior hospitalizations?   Yes   2.What is your expectation for pain management during this hospitalization?     Epidural  3.How can we help you reach that goal? Epidural in situ.  Record the patient's initial score and the patient's pain goal.   Pain: 0  Pain Goal: 6 [facility] wants you to be able to say your pain was always managed very well.  Leiann Sporer L 05/12/2016

## 2016-05-12 NOTE — Lactation Note (Signed)
This note was copied from a baby's chart. Lactation Consultation Note  Patient Name: Karina Jones [id number] Date: 05/12/2016 Reason for consult: Initial assessment   Initial assessment with Exp BF mom of 1 hour old infant. Infant has fed for 30 minutes per mom and RN and was under warmer getting weighed and measured. Mom reports she had some early nipple soreness with first child that resolved with corrected positioning. Mom with history of [place] to breast.   Infant returned to mom and mom latched her independently in the cradle hold. Discussed using cross cradle or football hold in the early week with lots of pillow and head support with feedings. Mom voiced understanding. Mom with large soft compressible breasts and areola and everted nipples.   Enc mom to feed infant STS 8-12 x in 24 hours at first feeding cues. Enc mom to call out for assistance as needed. Feeding log given with instructions for use.   [place] Brochure and BF Resources Handout given, mom informed of IP/OP Services, BF Support Groups and [place] phone #. Mom is an employee at [employer] and plans to pick up pump at d/c. Different pumps explained to mom.    Maternal Data Formula Feeding for Exclusion: No Does the patient have breastfeeding experience prior to this delivery?: Yes  Feeding Feeding Type: Breast Fed Length of feed: 10 min  LATCH Score/Interventions Latch: Grasps breast easily, tongue down, lips flanged, rhythmical sucking.  Audible Swallowing: Spontaneous and intermittent  Type of Nipple: Everted at rest and after stimulation  Comfort (Breast/Nipple): Soft / non-tender     Hold (Positioning): No assistance needed to correctly position infant at breast.  LATCH Score: 10  Lactation Tools Discussed/Used WIC Program: No   Consult Status Consult Status: Follow-up Date: 05/13/16 Follow-up type: In-patient    Karina Jones 05/12/2016, 12:14 PM

## 2016-05-13 LAB — RPR: RPR Ser Ql: NONREACTIVE

## 2016-05-13 MED ORDER — IBUPROFEN 600 MG PO TABS: 600.0000 mg | ORAL_TABLET | Freq: Four times a day (QID) | ORAL | 0 refills | Status: DC | PRN

## 2016-05-13 NOTE — Lactation Note (Addendum)
This note was copied from a baby's chart. Lactation Consultation Note  MOB is concerned because baby is always on the breast. She has fed 9 times in the last 24 hours. Her nipples are a bit sore. Assisted her with deeper latch and breast compression to aid in transfer. Colostrum is easily expressible. Output is appropriate for day of life.  Baby had one period where she slept for 3 hours.  One of her previous children used an SNS and she is wondering if this baby needs one.  Explained that clinically baby is doing everything currently expected for being 28 hours of life.  Bilirubin is increasing slowly.Follow-up if SNS is desired by parents.  Patient Name: Karina Jones [id number] Date: 05/13/2016 Reason for consult: Follow-up assessment   Maternal Data    Feeding Feeding Type: Breast Fed Length of feed: 120 min  LATCH Score/Interventions Latch: Repeated attempts needed to sustain latch, nipple held in mouth throughout feeding, stimulation needed to elicit sucking reflex.  Audible Swallowing: A few with stimulation  Type of Nipple: Everted at rest and after stimulation  Comfort (Breast/Nipple): Soft / non-tender     Hold (Positioning): Assistance needed to correctly position infant at breast and maintain latch.  LATCH Score: 7  Lactation Tools Discussed/Used     Consult Status      Karina Jones 05/13/2016, 3:30 PM

## 2016-05-13 NOTE — Discharge Summary (Signed)
OB Discharge Summary     Patient Name: Karina Jones DOB: 1975-05-14 MRN: [id number]  Date of admission: 05/12/2016 Delivering MD: Verita Schneiders A   Date of discharge: 05/13/2016  Admitting diagnosis: [redacted]w[redacted]d, Contractions  Intrauterine pregnancy: [redacted]w[redacted]d     Secondary diagnosis:  Principal Problem:   SVD (spontaneous vaginal delivery) SROM  Additional problems: AMA; hx macrosomic infant     Discharge diagnosis: Term Pregnancy Delivered                                                                                                Post partum procedures:none  Augmentation: none  Complications: None  Hospital course:  Onset of Labor With Vaginal Delivery     41 y.o. yo [id number] at [redacted]w[redacted]d was admitted in Latent Labor on 05/12/2016. Patient had an uncomplicated labor course as follows:  Membrane Rupture Time/Date: 4:15 AM ,05/12/2016   Intrapartum Procedures: Episiotomy: None [1]                                         Lacerations:  Labial [10];2nd degree [3];Perineal [11]  Patient had a delivery of a Viable infant. 05/12/2016  Information for the patient's newborn:  Letta, [place] Girl Fernanda [id number]       Pateint had an uncomplicated postpartum course.  She is ambulating, tolerating a regular diet, passing flatus, and urinating well. Patient is discharged home in stable condition on 05/13/16.   Physical exam  Vitals:   05/12/16 1529 05/12/16 1731 05/13/16 0140 05/13/16 0500  BP:  123/71 120/74 126/76  Pulse:  79 85 87  Resp:  16 18 18   Temp: 98.9 F (37.2 C) 98.3 F (36.8 C) 98.3 F (36.8 C) 98 F (36.7 C)  TempSrc: Oral Oral Oral   SpO2:      Weight:      Height:       General: alert and cooperative Lochia: appropriate Uterine Fundus: firm Incision: N/A DVT Evaluation: No evidence of DVT seen on physical exam. Labs: Lab Results  Component Value Date   WBC 10.9 (H) 05/12/2016   HGB 10.7 (L) 05/12/2016   HCT 33.5 (L) 05/12/2016   MCV 81.3 05/12/2016   PLT  224 05/12/2016   No flowsheet data found.  Discharge instruction: per After Visit Summary and "Baby and Me Booklet".  After visit meds:  Allergies as of 05/13/2016   No Known Allergies     Medication List    STOP taking these medications   simethicone 80 MG chewable tablet Commonly known as:  MYLICON   triamcinolone 0.025 % cream Commonly known as:  KENALOG     TAKE these medications   ibuprofen 600 MG tablet Commonly known as:  ADVIL,MOTRIN Take 1 tablet (600 mg total) by mouth every 6 (six) hours as needed.   PRENATAL VITAMIN PO Take by mouth daily.       Diet: routine diet  Activity: Advance as tolerated. Pelvic rest for 6 weeks.   Outpatient follow up:6 weeks Follow  up Appt:No future appointments. Follow up Visit:No Follow-up on file.  Postpartum contraception: Vasectomy  Newborn Data: Live born female  Birth Weight: 9 lb 14.6 oz (4495 g) APGAR: 8, 9  Baby Feeding: Breast Disposition:home with mother   05/13/2016 Serita Grammes, CNM  8:40 AM

## 2016-05-13 NOTE — Discharge Instructions (Signed)

## 2016-05-13 NOTE — Lactation Note (Signed)
This note was copied from a baby's chart. Lactation Consultation Note  Patient Name: Karina Jones [id number] Date: 05/13/2016 Reason for consult: Follow-up assessment Baby at 36 hr of life and dyad is set for D/C tomorrow. Mom is nervious about going home on a Sunday and the baby not bf well. She is requesting a SNS to take home. She used on with her older child for 2 days, after her milk came in she did not have any more supply issues. Gave her 5Fr and 59ml syringe and showed her how to use it. 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 Paperwork is filled out on [employer]. Parents are aware of lactation services and support group.   Maternal Data    Feeding Feeding Type: Breast Fed Length of feed: 35 min  LATCH Score/Interventions                      Lactation Tools Discussed/Used     Consult Status Consult Status: Follow-up Date: 05/14/16 Follow-up type: In-patient    Denzil Hughes 05/13/2016, 10:57 PM

## 2016-05-14 DIAGNOSIS — O360190 Maternal care for anti-D [Rh] antibodies, unspecified trimester, not applicable or unspecified: Secondary | ICD-10-CM

## 2016-05-14 DIAGNOSIS — Z3A40 40 weeks gestation of pregnancy: Secondary | ICD-10-CM

## 2016-05-14 NOTE — Lactation Note (Addendum)
This note was copied from a baby's chart. Lactation Consultation Note  SNS initiated to satisfy infant.  She has used it 2 x in 9 hours. Encouraged mother to finish on one breast before transferring baby to the second side. Mentioned she may want to add a few post-pumping sessions to encourage milk to come to volume sooner. Informed of support group and outpatient services.  Patient Name: Karina Jones [id number] Date: 05/14/2016     Maternal Data    Feeding Feeding Type: Breast Fed Length of feed: 20 min  LATCH Score/Interventions Latch: Grasps breast easily, tongue down, lips flanged, rhythmical sucking.  Audible Swallowing: A few with stimulation  Type of Nipple: Everted at rest and after stimulation  Comfort (Breast/Nipple): Soft / non-tender     Hold (Positioning): No assistance needed to correctly position infant at breast.  LATCH Score: 9  Lactation Tools Discussed/Used     Consult Status      Van Clines 05/14/2016, 9:07 AM

## 2016-05-14 NOTE — Discharge Summary (Signed)
OB Discharge Summary  Patient Name: Karina Jones DOB: 1975-10-14 MRN: [id number]  Date of admission: 05/12/2016 Delivering MD: Verita Schneiders A   Date of discharge: 05/14/2016  Admitting diagnosis: [redacted]w[redacted]d, Contractions  Intrauterine pregnancy: [redacted]w[redacted]d     Secondary diagnosis:Principal Problem:   SVD (spontaneous vaginal delivery)  Additional problems: none, using SNS for breastfeeding.      Discharge diagnosis: Term Pregnancy Delivered                                                                     Post partum procedures:none  Augmentation: none  Complications: None  Hospital course:  Onset of Labor With Vaginal Delivery     41 y.o. yo [id number] at [redacted]w[redacted]d was admitted in Active Labor on 05/12/2016. Patient had an uncomplicated labor course as follows:  Membrane Rupture Time/Date: 4:15 AM ,05/12/2016   Intrapartum Procedures: Episiotomy: None [1]                                         Lacerations:  Labial [10];2nd degree [3];Perineal [11]  Patient had a delivery of a Viable baby girl 05/12/2016  Information for the patient's newborn:  Alexica, [place] Girl Zamariya [id number]       Pateint had an uncomplicated postpartum course.  She is ambulating, tolerating a regular diet, passing flatus, and urinating well. Patient is discharged home in stable condition on 05/14/16.   Physical exam  Vitals:   05/13/16 0140 05/13/16 0500 05/13/16 1803 05/14/16 0545  BP: 120/74 126/76 127/83 140/70  Pulse: 85 87 81 85  Resp: 18 18 14 18   Temp: 98.3 F (36.8 C) 98 F (36.7 C) 98.4 F (36.9 C) 98.2 F (36.8 C)  TempSrc: Oral  Oral Oral  SpO2:   96%   Weight:      Height:       General: alert Lochia: appropriate Uterine Fundus: firm Incision: N/A DVT Evaluation: No evidence of DVT seen on physical exam. Labs: Lab Results  Component Value Date   WBC 10.9 (H) 05/12/2016   HGB 10.7 (L) 05/12/2016   HCT 33.5 (L) 05/12/2016   MCV 81.3 05/12/2016   PLT 224 05/12/2016   No  flowsheet data found.  Discharge instruction: per After Visit Summary and "Baby and Me Booklet".  After Visit Meds:  Allergies as of 05/14/2016   No Known Allergies     Medication List    STOP taking these medications   simethicone 80 MG chewable tablet Commonly known as:  MYLICON   triamcinolone 0.025 % cream Commonly known as:  KENALOG     TAKE these medications   ibuprofen 600 MG tablet Commonly known as:  ADVIL,MOTRIN Take 1 tablet (600 mg total) by mouth every 6 (six) hours as needed.   PRENATAL VITAMIN PO Take by mouth daily.       Diet: routine diet  Activity: Advance as tolerated. Pelvic rest for 6 weeks.   [facility] Follow [facility] Appt:No future appointments. Follow up visit: No Follow-up on file.  Postpartum contraception: Vasectomy and Condoms  Newborn Data: Live born female  Birth  Weight: 9 lb 14.6 oz (4495 g) APGAR: 8, 9  Baby Feeding: Breast Disposition:home with mother   05/14/2016 Gerda Diss, Student-MidWife

## 2016-05-18 ENCOUNTER — Inpatient Hospital Stay (HOSPITAL_COMMUNITY): Admission: RE | Admit: 2016-05-18 | Payer: 59 | Source: Ambulatory Visit

## 2016-06-20 ENCOUNTER — Ambulatory Visit: Payer: 59 | Admitting: Women's Health

## 2016-06-21 DIAGNOSIS — O2686 Pruritic urticarial papules and plaques of pregnancy (PUPPP): Secondary | ICD-10-CM | POA: Diagnosis not present

## 2016-06-21 DIAGNOSIS — Z1283 Encounter for screening for malignant neoplasm of skin: Secondary | ICD-10-CM | POA: Diagnosis not present

## 2016-06-21 DIAGNOSIS — Z08 Encounter for follow-up examination after completed treatment for malignant neoplasm: Secondary | ICD-10-CM | POA: Diagnosis not present

## 2016-06-21 DIAGNOSIS — Z85828 Personal history of other malignant neoplasm of skin: Secondary | ICD-10-CM | POA: Diagnosis not present

## 2016-06-21 DIAGNOSIS — B078 Other viral warts: Secondary | ICD-10-CM | POA: Diagnosis not present

## 2016-06-27 ENCOUNTER — Ambulatory Visit (INDEPENDENT_AMBULATORY_CARE_PROVIDER_SITE_OTHER): Payer: 59 | Admitting: Obstetrics & Gynecology

## 2016-06-27 ENCOUNTER — Encounter: Payer: Self-pay | Admitting: Obstetrics & Gynecology

## 2016-06-27 NOTE — Progress Notes (Signed)
Subjective:     Karina Jones is a 41 y.o. female who presents for a postpartum visit. She is 6 weeks postpartum following a spontaneous vaginal delivery. I have fully reviewed the prenatal and intrapartum course. The delivery was at 40 gestational weeks. Outcome: spontaneous vaginal delivery. Anesthesia: epidural. Postpartum course has been unremarkeable. Baby's course has been normal. Baby is feeding by breast. Bleeding thin lochia. Bowel function is normal. Bladder function is normal. Patient is not sexually active. Contraception method is none planned vasectomyPostpartum depression screening: negative.  The following portions of the patient's history were reviewed and updated as appropriate: allergies, current medications, past family history, past medical history, past social history, past surgical history and problem list.  Review of Systems Pertinent items are noted in HPI.   Objective:    BP 118/66 (BP Location: Left Arm, Patient Position: Sitting, Cuff Size: Normal)   Pulse 60   Wt 157 lb (71.2 kg)   Breastfeeding? Yes   BMI 23.18 kg/m   General:  alert, cooperative and no distress   Breasts:  inspection negative, no nipple discharge or bleeding, no masses or nodularity palpable  Lungs:   Heart:    Abdomen: soft, non-tender; bowel sounds normal; no masses,  no organomegaly   Vulva:  normal  Vagina: normal vagina, no discharge, exudate, lesion, or erythema  Cervix:  no cervical motion tenderness and no lesions  Corpus: normal size, contour, position, consistency, mobility, non-tender  Adnexa:  normal adnexa  Rectal Exam:         Assessment:     normal postpartum exam. Pap smear not done at today's visit.   Plan:    1. Contraception: vasectomy 2.  3. Follow up in: 1 year or as needed.

## 2016-12-13 DIAGNOSIS — Z08 Encounter for follow-up examination after completed treatment for malignant neoplasm: Secondary | ICD-10-CM | POA: Diagnosis not present

## 2016-12-13 DIAGNOSIS — Z85828 Personal history of other malignant neoplasm of skin: Secondary | ICD-10-CM | POA: Diagnosis not present

## 2017-01-04 DIAGNOSIS — Z01419 Encounter for gynecological examination (general) (routine) without abnormal findings: Secondary | ICD-10-CM | POA: Diagnosis not present

## 2017-02-20 DIAGNOSIS — L659 Nonscarring hair loss, unspecified: Secondary | ICD-10-CM | POA: Diagnosis not present

## 2017-06-05 DIAGNOSIS — Z08 Encounter for follow-up examination after completed treatment for malignant neoplasm: Secondary | ICD-10-CM | POA: Diagnosis not present

## 2017-06-05 DIAGNOSIS — Z1283 Encounter for screening for malignant neoplasm of skin: Secondary | ICD-10-CM | POA: Diagnosis not present

## 2017-06-05 DIAGNOSIS — Z85828 Personal history of other malignant neoplasm of skin: Secondary | ICD-10-CM | POA: Diagnosis not present

## 2017-07-24 DIAGNOSIS — H52223 Regular astigmatism, bilateral: Secondary | ICD-10-CM | POA: Diagnosis not present

## 2017-09-07 DIAGNOSIS — N6011 Diffuse cystic mastopathy of right breast: Secondary | ICD-10-CM | POA: Diagnosis not present

## 2017-12-04 ENCOUNTER — Other Ambulatory Visit: Payer: Self-pay | Admitting: Unknown Physician Specialty

## 2017-12-04 DIAGNOSIS — Z1231 Encounter for screening mammogram for malignant neoplasm of breast: Secondary | ICD-10-CM

## 2018-01-08 DIAGNOSIS — Z23 Encounter for immunization: Secondary | ICD-10-CM | POA: Diagnosis not present

## 2018-01-25 ENCOUNTER — Ambulatory Visit
Admission: RE | Admit: 2018-01-25 | Discharge: 2018-01-25 | Disposition: A | Discharge status: Home / Self Care | Payer: 59 | Source: Ambulatory Visit | Attending: Unknown Physician Specialty | Admitting: Unknown Physician Specialty

## 2018-01-25 DIAGNOSIS — Z1231 Encounter for screening mammogram for malignant neoplasm of breast: Secondary | ICD-10-CM

## 2018-02-01 DIAGNOSIS — Z01419 Encounter for gynecological examination (general) (routine) without abnormal findings: Secondary | ICD-10-CM | POA: Diagnosis not present

## 2018-04-10 ENCOUNTER — Other Ambulatory Visit (HOSPITAL_COMMUNITY)
Admission: RE | Admit: 2018-04-10 | Discharge: 2018-04-10 | Disposition: A | Discharge status: Home / Self Care | Payer: 59 | Source: Ambulatory Visit | Attending: Unknown Physician Specialty | Admitting: Unknown Physician Specialty

## 2018-04-10 DIAGNOSIS — Z01419 Encounter for gynecological examination (general) (routine) without abnormal findings: Secondary | ICD-10-CM | POA: Diagnosis not present

## 2018-04-10 DIAGNOSIS — Z13220 Encounter for screening for lipoid disorders: Secondary | ICD-10-CM | POA: Diagnosis not present

## 2018-04-10 LAB — COMPREHENSIVE METABOLIC PANEL WITH GFR
ALT: 32 U/L (ref 0–44)
AST: 34 U/L (ref 15–41)
Albumin: 4 g/dL (ref 3.5–5.0)
Alkaline Phosphatase: 30 U/L — ABNORMAL LOW (ref 38–126)
Anion gap: 6 (ref 5–15)
BUN: 17 mg/dL (ref 6–20)
CO2: 24 mmol/L (ref 22–32)
Calcium: 9.2 mg/dL (ref 8.9–10.3)
Chloride: 106 mmol/L (ref 98–111)
Creatinine, Ser: 0.65 mg/dL (ref 0.44–1.00)
GFR calc Af Amer: >60 mL/min (ref >60)
GFR calc non Af Amer: >60 mL/min (ref >60)
Glucose, Bld: 91 mg/dL (ref 70–99)
Potassium: 4.4 mmol/L (ref 3.5–5.1)
Sodium: 136 mmol/L (ref 135–145)
Total Bilirubin: 1.4 mg/dL — ABNORMAL HIGH (ref 0.3–1.2)
Total Protein: 7.2 g/dL (ref 6.5–8.1)

## 2018-04-10 LAB — LIPID PANEL
Cholesterol: 202 mg/dL — ABNORMAL HIGH (ref 0–200)
HDL: 75 mg/dL (ref >40)
LDL Cholesterol: 122 mg/dL — ABNORMAL HIGH (ref 0–99)
Total CHOL/HDL Ratio: 2.7 RATIO
Triglycerides: 24 mg/dL (ref <150)
VLDL: 5 mg/dL (ref 0–40)

## 2018-04-10 LAB — CBC
HEMATOCRIT: 42.1 % (ref 36.0–46.0)
Hemoglobin: 13.1 g/dL (ref 12.0–15.0)
MCH: 27.8 pg (ref 26.0–34.0)
MCHC: 31.1 g/dL (ref 30.0–36.0)
MCV: 89.2 fL (ref 80.0–100.0)
NRBC: 0 % (ref 0.0–0.2)
Platelets: 231 10*3/uL (ref 150–400)
RBC: 4.72 MIL/uL (ref 3.87–5.11)
RDW: 12.6 % (ref 11.5–15.5)
WBC: 4.3 10*3/uL (ref 4.0–10.5)

## 2018-06-04 DIAGNOSIS — Z1283 Encounter for screening for malignant neoplasm of skin: Secondary | ICD-10-CM | POA: Diagnosis not present

## 2018-06-04 DIAGNOSIS — D225 Melanocytic nevi of trunk: Secondary | ICD-10-CM | POA: Diagnosis not present

## 2018-06-04 DIAGNOSIS — Z85828 Personal history of other malignant neoplasm of skin: Secondary | ICD-10-CM | POA: Diagnosis not present

## 2018-06-04 DIAGNOSIS — Z08 Encounter for follow-up examination after completed treatment for malignant neoplasm: Secondary | ICD-10-CM | POA: Diagnosis not present

## 2018-09-16 DIAGNOSIS — B079 Viral wart, unspecified: Secondary | ICD-10-CM | POA: Diagnosis not present

## 2018-09-16 DIAGNOSIS — Z6820 Body mass index (BMI) 20.0-20.9, adult: Secondary | ICD-10-CM | POA: Diagnosis not present

## 2018-10-02 ENCOUNTER — Other Ambulatory Visit: Payer: Self-pay | Admitting: Unknown Physician Specialty

## 2018-10-02 DIAGNOSIS — Z1231 Encounter for screening mammogram for malignant neoplasm of breast: Secondary | ICD-10-CM

## 2018-12-25 DIAGNOSIS — Z7689 Persons encountering health services in other specified circumstances: Secondary | ICD-10-CM | POA: Diagnosis not present

## 2018-12-25 DIAGNOSIS — R300 Dysuria: Secondary | ICD-10-CM | POA: Diagnosis not present

## 2019-01-23 DIAGNOSIS — Z23 Encounter for immunization: Secondary | ICD-10-CM | POA: Diagnosis not present

## 2019-01-27 ENCOUNTER — Other Ambulatory Visit: Payer: Self-pay

## 2019-01-27 ENCOUNTER — Ambulatory Visit
Admission: RE | Admit: 2019-01-27 | Discharge: 2019-01-27 | Disposition: A | Discharge status: Home / Self Care | Payer: 59 | Source: Ambulatory Visit | Attending: Unknown Physician Specialty | Admitting: Unknown Physician Specialty

## 2019-01-27 DIAGNOSIS — Z1231 Encounter for screening mammogram for malignant neoplasm of breast: Secondary | ICD-10-CM | POA: Diagnosis not present

## 2019-02-10 DIAGNOSIS — Z01419 Encounter for gynecological examination (general) (routine) without abnormal findings: Secondary | ICD-10-CM | POA: Diagnosis not present

## 2019-06-03 DIAGNOSIS — Z08 Encounter for follow-up examination after completed treatment for malignant neoplasm: Secondary | ICD-10-CM | POA: Diagnosis not present

## 2019-06-03 DIAGNOSIS — Z85828 Personal history of other malignant neoplasm of skin: Secondary | ICD-10-CM | POA: Diagnosis not present

## 2019-06-03 DIAGNOSIS — D225 Melanocytic nevi of trunk: Secondary | ICD-10-CM | POA: Diagnosis not present

## 2019-06-03 DIAGNOSIS — Z1283 Encounter for screening for malignant neoplasm of skin: Secondary | ICD-10-CM | POA: Diagnosis not present

## 2019-10-20 DIAGNOSIS — R300 Dysuria: Secondary | ICD-10-CM | POA: Diagnosis not present

## 2019-12-09 ENCOUNTER — Other Ambulatory Visit: Payer: Self-pay | Admitting: Unknown Physician Specialty

## 2019-12-09 DIAGNOSIS — Z1231 Encounter for screening mammogram for malignant neoplasm of breast: Secondary | ICD-10-CM

## 2019-12-22 DIAGNOSIS — R1032 Left lower quadrant pain: Secondary | ICD-10-CM | POA: Diagnosis not present

## 2019-12-22 DIAGNOSIS — R102 Pelvic and perineal pain: Secondary | ICD-10-CM | POA: Diagnosis not present

## 2019-12-22 DIAGNOSIS — R198 Other specified symptoms and signs involving the digestive system and abdomen: Secondary | ICD-10-CM | POA: Diagnosis not present

## 2019-12-23 DIAGNOSIS — N83209 Unspecified ovarian cyst, unspecified side: Secondary | ICD-10-CM | POA: Diagnosis not present

## 2019-12-23 DIAGNOSIS — Z6822 Body mass index (BMI) 22.0-22.9, adult: Secondary | ICD-10-CM | POA: Diagnosis not present

## 2020-01-12 DIAGNOSIS — R188 Other ascites: Secondary | ICD-10-CM | POA: Diagnosis not present

## 2020-01-12 DIAGNOSIS — R10814 Left lower quadrant abdominal tenderness: Secondary | ICD-10-CM | POA: Diagnosis not present

## 2020-01-12 DIAGNOSIS — N854 Malposition of uterus: Secondary | ICD-10-CM | POA: Diagnosis not present

## 2020-01-12 DIAGNOSIS — N83209 Unspecified ovarian cyst, unspecified side: Secondary | ICD-10-CM | POA: Diagnosis not present

## 2020-01-12 DIAGNOSIS — R1032 Left lower quadrant pain: Secondary | ICD-10-CM | POA: Diagnosis not present

## 2020-01-12 DIAGNOSIS — R10811 Right upper quadrant abdominal tenderness: Secondary | ICD-10-CM | POA: Diagnosis not present

## 2020-01-15 DIAGNOSIS — Z20828 Contact with and (suspected) exposure to other viral communicable diseases: Secondary | ICD-10-CM | POA: Diagnosis not present

## 2020-01-16 DIAGNOSIS — R1032 Left lower quadrant pain: Secondary | ICD-10-CM | POA: Diagnosis not present

## 2020-01-16 DIAGNOSIS — R10811 Right upper quadrant abdominal tenderness: Secondary | ICD-10-CM | POA: Diagnosis not present

## 2020-01-16 DIAGNOSIS — N83209 Unspecified ovarian cyst, unspecified side: Secondary | ICD-10-CM | POA: Diagnosis not present

## 2020-01-16 DIAGNOSIS — R10814 Left lower quadrant abdominal tenderness: Secondary | ICD-10-CM | POA: Diagnosis not present

## 2020-01-28 ENCOUNTER — Ambulatory Visit
Admission: RE | Admit: 2020-01-28 | Discharge: 2020-01-28 | Disposition: A | Discharge status: Home / Self Care | Payer: 59 | Source: Ambulatory Visit | Attending: Unknown Physician Specialty | Admitting: Unknown Physician Specialty

## 2020-01-28 ENCOUNTER — Other Ambulatory Visit: Payer: Self-pay

## 2020-01-28 DIAGNOSIS — Z1231 Encounter for screening mammogram for malignant neoplasm of breast: Secondary | ICD-10-CM

## 2020-02-24 DIAGNOSIS — Z01419 Encounter for gynecological examination (general) (routine) without abnormal findings: Secondary | ICD-10-CM | POA: Diagnosis not present

## 2020-06-09 DIAGNOSIS — Z1283 Encounter for screening for malignant neoplasm of skin: Secondary | ICD-10-CM | POA: Diagnosis not present

## 2020-06-09 DIAGNOSIS — L570 Actinic keratosis: Secondary | ICD-10-CM | POA: Diagnosis not present

## 2020-06-09 DIAGNOSIS — D485 Neoplasm of uncertain behavior of skin: Secondary | ICD-10-CM | POA: Diagnosis not present

## 2020-06-09 DIAGNOSIS — D225 Melanocytic nevi of trunk: Secondary | ICD-10-CM | POA: Diagnosis not present

## 2020-06-09 DIAGNOSIS — D2261 Melanocytic nevi of right upper limb, including shoulder: Secondary | ICD-10-CM | POA: Diagnosis not present

## 2020-06-09 DIAGNOSIS — X32XXXD Exposure to sunlight, subsequent encounter: Secondary | ICD-10-CM | POA: Diagnosis not present

## 2020-09-20 DIAGNOSIS — Z1283 Encounter for screening for malignant neoplasm of skin: Secondary | ICD-10-CM | POA: Diagnosis not present

## 2020-09-20 DIAGNOSIS — L308 Other specified dermatitis: Secondary | ICD-10-CM | POA: Diagnosis not present

## 2020-09-20 DIAGNOSIS — D225 Melanocytic nevi of trunk: Secondary | ICD-10-CM | POA: Diagnosis not present

## 2020-10-19 DIAGNOSIS — Z111 Encounter for screening for respiratory tuberculosis: Secondary | ICD-10-CM | POA: Diagnosis not present

## 2020-12-14 ENCOUNTER — Other Ambulatory Visit: Payer: Self-pay | Admitting: Unknown Physician Specialty

## 2020-12-14 DIAGNOSIS — Z1231 Encounter for screening mammogram for malignant neoplasm of breast: Secondary | ICD-10-CM

## 2020-12-27 IMAGING — MG DIGITAL SCREENING BILAT W/ TOMO W/ CAD
8 series · 8 of 24 positions shown · non-contrast
Comparison: Previous exam(s).

CLINICAL DATA: Screening.

EXAM:
DIGITAL SCREENING BILATERAL MAMMOGRAM WITH TOMO AND CAD

[L CC synth-2D]
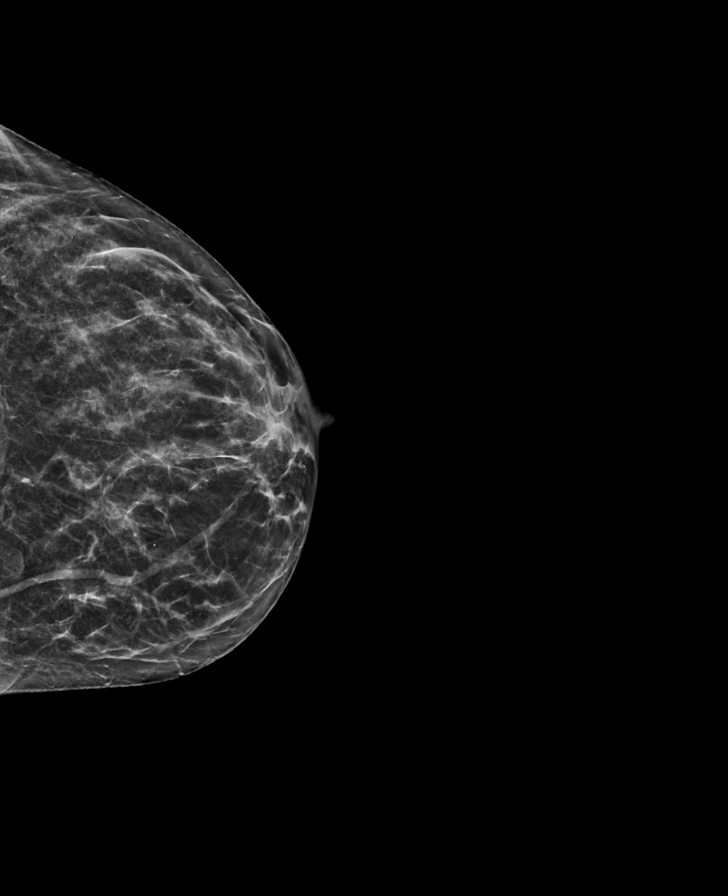

[R MLO synth-2D]
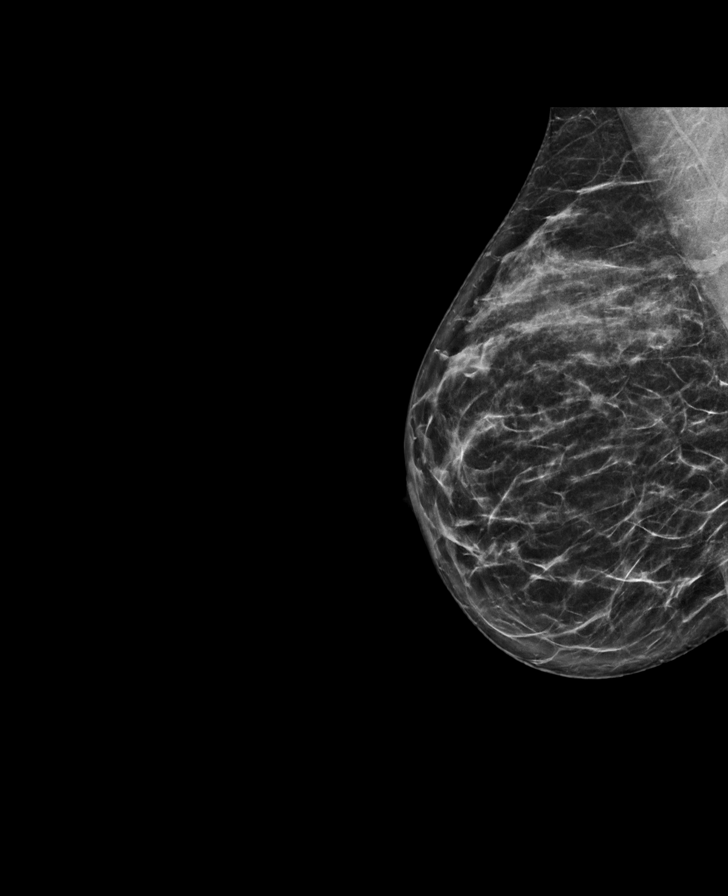

[L MLO synth-2D]
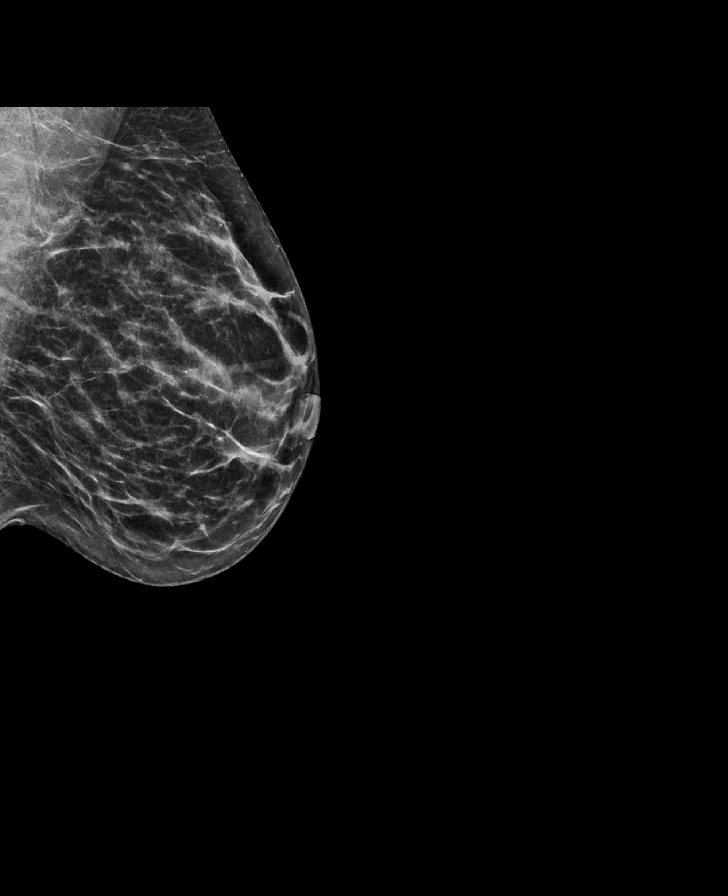

[R CC synth-2D]
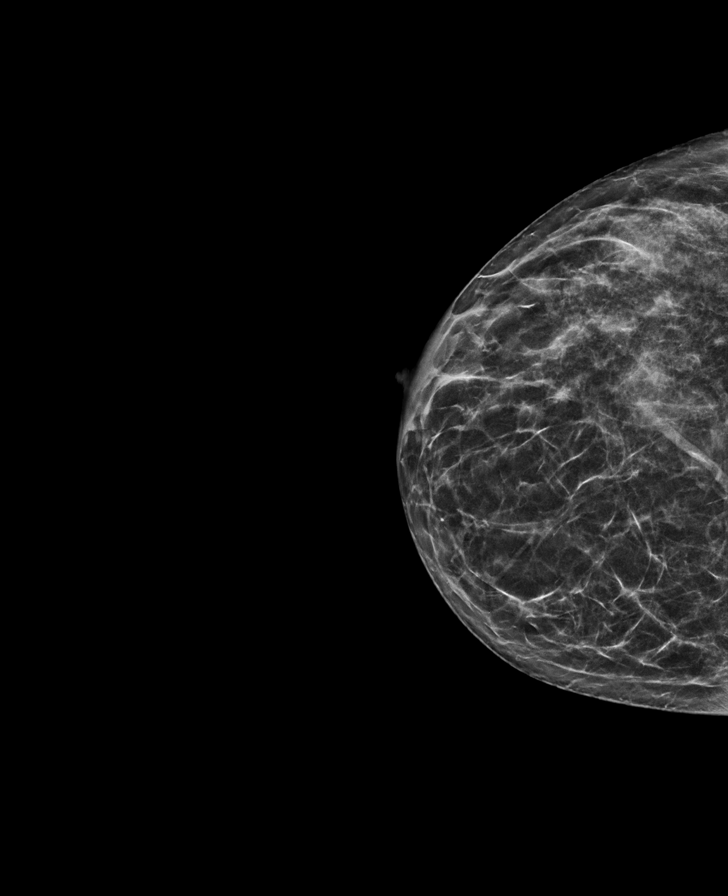

[R CC tomo · tomo slice 29/57.0]
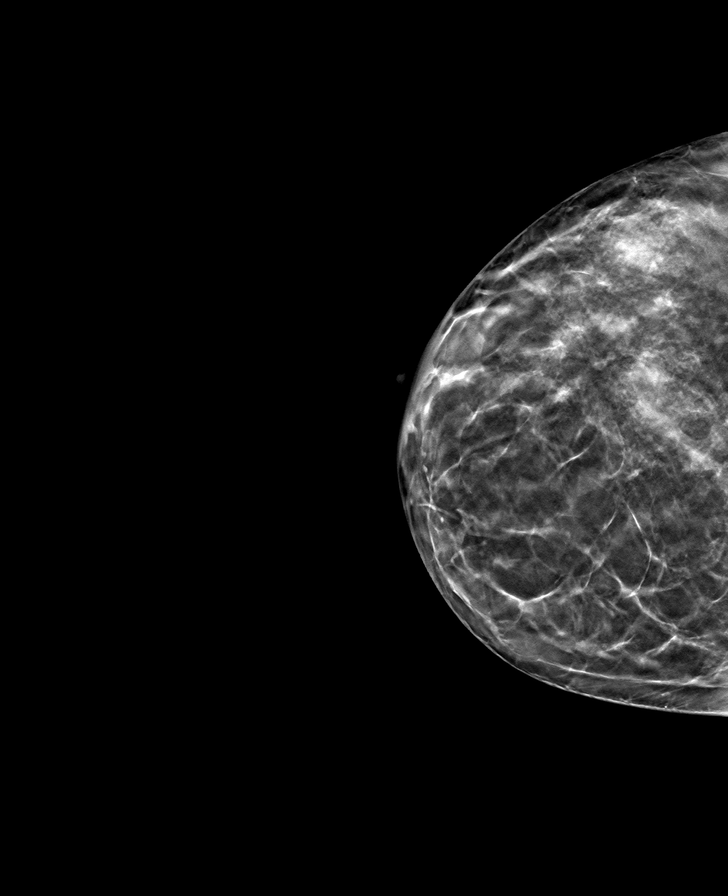

[R MLO tomo · tomo slice 33/65.0]
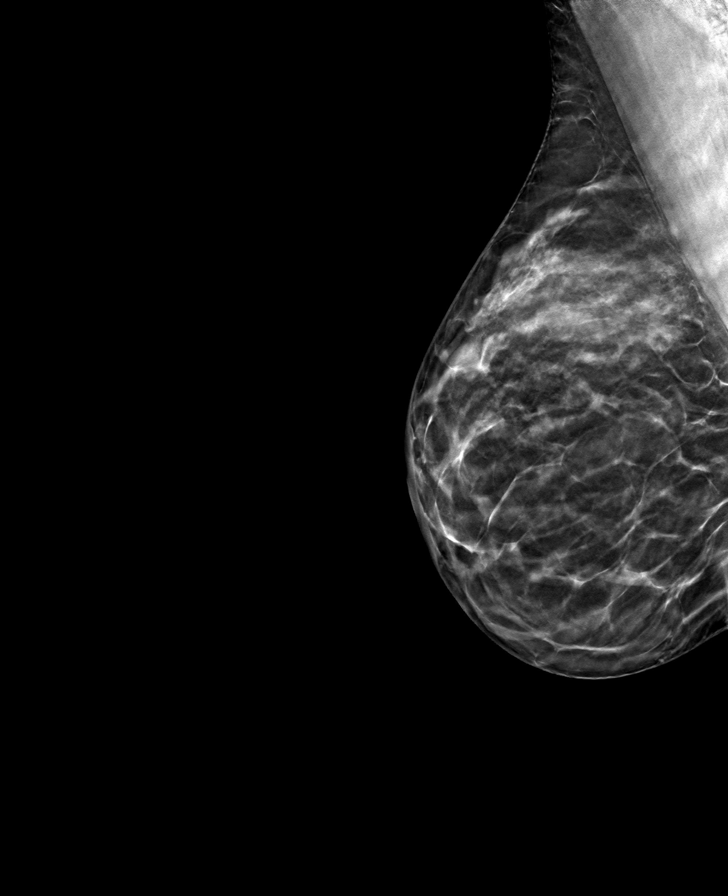

[L CC tomo · tomo slice 29/56.0]
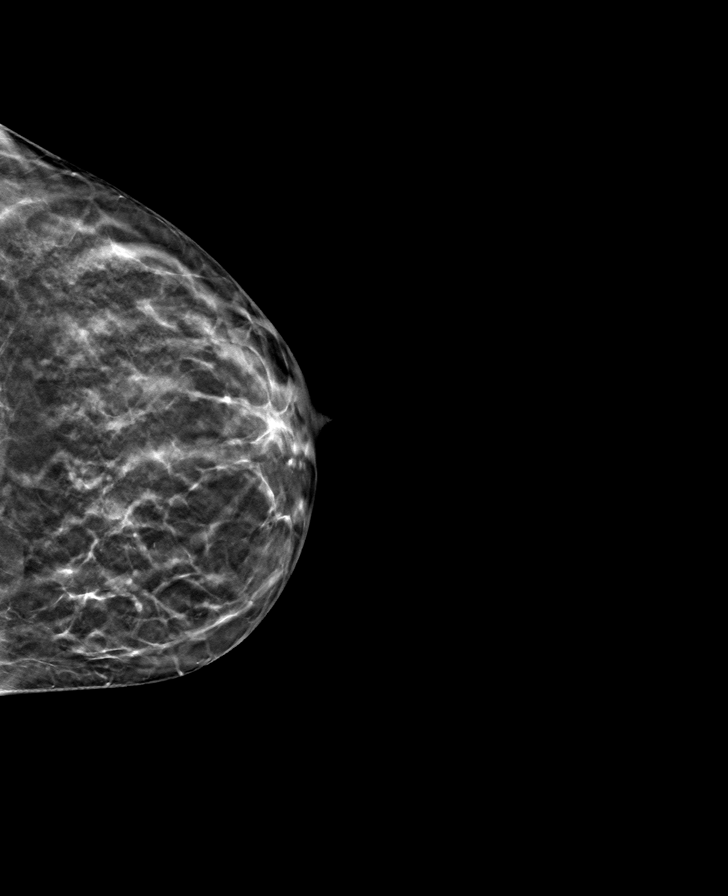

[L MLO tomo · tomo slice 31/61.0]
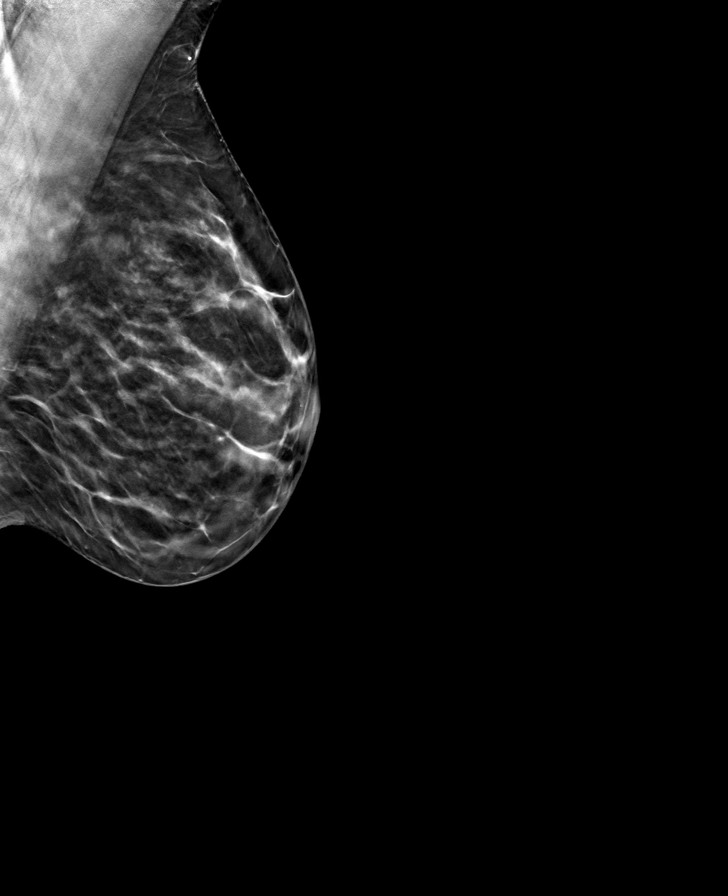

[8 of 24 positions shown; findings below may reference images not displayed]

ACR Breast Density Category c: The breast tissue is heterogeneously
dense, which may obscure small masses.
FINDINGS: There are no findings suspicious for malignancy. Images were
processed with CAD.
IMPRESSION: No mammographic evidence of malignancy. A result letter of this
screening mammogram will be mailed directly to the patient.

RECOMMENDATION:
Screening mammogram in one year. (Code:FT-U-LHB)

BI-RADS CATEGORY  1: Negative.

## 2021-01-31 ENCOUNTER — Other Ambulatory Visit: Payer: Self-pay

## 2021-01-31 ENCOUNTER — Ambulatory Visit
Admission: RE | Admit: 2021-01-31 | Discharge: 2021-01-31 | Disposition: A | Discharge status: Home / Self Care | Payer: 59 | Source: Ambulatory Visit | Attending: Unknown Physician Specialty | Admitting: Unknown Physician Specialty

## 2021-01-31 DIAGNOSIS — Z1231 Encounter for screening mammogram for malignant neoplasm of breast: Secondary | ICD-10-CM | POA: Diagnosis not present

## 2021-03-02 DIAGNOSIS — Z0000 Encounter for general adult medical examination without abnormal findings: Secondary | ICD-10-CM | POA: Diagnosis not present

## 2021-08-18 DIAGNOSIS — S01311A Laceration without foreign body of right ear, initial encounter: Secondary | ICD-10-CM | POA: Diagnosis not present

## 2021-08-18 DIAGNOSIS — Z6822 Body mass index (BMI) 22.0-22.9, adult: Secondary | ICD-10-CM | POA: Diagnosis not present

## 2021-11-09 DIAGNOSIS — H6982 Other specified disorders of Eustachian tube, left ear: Secondary | ICD-10-CM | POA: Diagnosis not present

## 2021-11-09 DIAGNOSIS — H9012 Conductive hearing loss, unilateral, left ear, with unrestricted hearing on the contralateral side: Secondary | ICD-10-CM | POA: Diagnosis not present

## 2021-12-12 ENCOUNTER — Other Ambulatory Visit: Payer: Self-pay | Admitting: Unknown Physician Specialty

## 2021-12-12 DIAGNOSIS — Z1231 Encounter for screening mammogram for malignant neoplasm of breast: Secondary | ICD-10-CM

## 2022-01-31 DIAGNOSIS — Z1283 Encounter for screening for malignant neoplasm of skin: Secondary | ICD-10-CM | POA: Diagnosis not present

## 2022-01-31 DIAGNOSIS — D225 Melanocytic nevi of trunk: Secondary | ICD-10-CM | POA: Diagnosis not present

## 2022-02-01 ENCOUNTER — Ambulatory Visit
Admission: RE | Admit: 2022-02-01 | Discharge: 2022-02-01 | Disposition: A | Discharge status: Home / Self Care | Payer: 59 | Source: Ambulatory Visit | Attending: Unknown Physician Specialty | Admitting: Unknown Physician Specialty

## 2022-02-01 DIAGNOSIS — Z1231 Encounter for screening mammogram for malignant neoplasm of breast: Secondary | ICD-10-CM

## 2022-03-06 DIAGNOSIS — Z01419 Encounter for gynecological examination (general) (routine) without abnormal findings: Secondary | ICD-10-CM | POA: Diagnosis not present

## 2022-03-06 DIAGNOSIS — N920 Excessive and frequent menstruation with regular cycle: Secondary | ICD-10-CM | POA: Diagnosis not present

## 2022-05-26 DIAGNOSIS — N938 Other specified abnormal uterine and vaginal bleeding: Secondary | ICD-10-CM | POA: Diagnosis not present

## 2022-12-12 ENCOUNTER — Other Ambulatory Visit: Payer: Self-pay | Admitting: Unknown Physician Specialty

## 2022-12-12 DIAGNOSIS — Z1231 Encounter for screening mammogram for malignant neoplasm of breast: Secondary | ICD-10-CM

## 2023-02-05 DIAGNOSIS — N909 Noninflammatory disorder of vulva and perineum, unspecified: Secondary | ICD-10-CM | POA: Diagnosis not present

## 2023-02-05 DIAGNOSIS — N898 Other specified noninflammatory disorders of vagina: Secondary | ICD-10-CM | POA: Diagnosis not present

## 2023-02-06 ENCOUNTER — Ambulatory Visit
Admission: RE | Admit: 2023-02-06 | Discharge: 2023-02-06 | Disposition: A | Discharge status: Home / Self Care | Payer: Commercial Managed Care - PPO | Source: Ambulatory Visit | Attending: Unknown Physician Specialty | Admitting: Unknown Physician Specialty

## 2023-02-06 DIAGNOSIS — Z1231 Encounter for screening mammogram for malignant neoplasm of breast: Secondary | ICD-10-CM | POA: Diagnosis not present

## 2023-04-30 DIAGNOSIS — N898 Other specified noninflammatory disorders of vagina: Secondary | ICD-10-CM | POA: Diagnosis not present

## 2023-04-30 DIAGNOSIS — N909 Noninflammatory disorder of vulva and perineum, unspecified: Secondary | ICD-10-CM | POA: Diagnosis not present

## 2023-05-01 DIAGNOSIS — N898 Other specified noninflammatory disorders of vagina: Secondary | ICD-10-CM | POA: Diagnosis not present

## 2023-05-01 DIAGNOSIS — N909 Noninflammatory disorder of vulva and perineum, unspecified: Secondary | ICD-10-CM | POA: Diagnosis not present

## 2023-05-22 DIAGNOSIS — N898 Other specified noninflammatory disorders of vagina: Secondary | ICD-10-CM | POA: Diagnosis not present

## 2023-05-22 DIAGNOSIS — Z01419 Encounter for gynecological examination (general) (routine) without abnormal findings: Secondary | ICD-10-CM | POA: Diagnosis not present

## 2023-05-22 DIAGNOSIS — N909 Noninflammatory disorder of vulva and perineum, unspecified: Secondary | ICD-10-CM | POA: Diagnosis not present

## 2023-05-28 ENCOUNTER — Telehealth: Payer: Commercial Managed Care - PPO | Admitting: Physician Assistant

## 2023-05-28 DIAGNOSIS — M5442 Lumbago with sciatica, left side: Secondary | ICD-10-CM | POA: Diagnosis not present

## 2023-05-28 MED ORDER — CYCLOBENZAPRINE HCL 10 MG PO TABS: 5.0000 mg | ORAL_TABLET | Freq: Three times a day (TID) | ORAL | 0 refills | Status: AC | PRN

## 2023-05-28 MED ORDER — METHYLPREDNISOLONE 4 MG PO TBPK: ORAL_TABLET | ORAL | 0 refills | Status: AC

## 2023-05-28 NOTE — Progress Notes (Signed)
 E-Visit for Back Pain   We are sorry that you are not feeling well.  Here is how we plan to help!  Based on what you have shared with me it looks like you mostly have acute back pain.  Acute back pain is defined as musculoskeletal pain that can resolve in 1-3 weeks with conservative treatment.  I have prescribed Medrol dose pack, a steroid anti-inflammatory, as well as Flexeril 10 mg every eight hours as needed which is a muscle relaxer  Some patients experience stomach irritation or in increased heartburn with anti-inflammatory drugs.  Please keep in mind that muscle relaxer's can cause fatigue and should not be taken while at work or driving.  Back pain is very common.  The pain often gets better over time.  The cause of back pain is usually not dangerous.  Most people can learn to manage their back pain on their own.  Home Care Stay active.  Start with short walks on flat ground if you can.  Try to walk farther each day. Do not sit, drive or stand in one place for more than 30 minutes.  Do not stay in bed. Do not avoid exercise or work.  Activity can help your back heal faster. Be careful when you bend or lift an object.  Bend at your knees, keep the object close to you, and do not twist. Sleep on a firm mattress.  Lie on your side, and bend your knees.  If you lie on your back, put a pillow under your knees. Only take medicines as told by your doctor. Put ice on the injured area. Put ice in a plastic bag Place a towel between your skin and the bag Leave the ice on for 15-20 minutes, 3-4 times a day for the first 2-3 days. 210 After that, you can switch between ice and heat packs. Ask your doctor about back exercises or massage. Avoid feeling anxious or stressed.  Find good ways to deal with stress, such as exercise.  Get Help Right Way If: Your pain does not go away with rest or medicine. Your pain does not go away in 1 week. You have new problems. You do not feel well. The pain  spreads into your legs. You cannot control when you poop (bowel movement) or pee (urinate) You feel sick to your stomach (nauseous) or throw up (vomit) You have belly (abdominal) pain. You feel like you may pass out (faint). If you develop a fever.  Make Sure you: Understand these instructions. Will watch your condition Will get help right away if you are not doing well or get worse.  Your e-visit answers were reviewed by a board certified advanced clinical practitioner to complete your personal care plan.  Depending on the condition, your plan could have included both over the counter or prescription medications.  If there is a problem please reply  once you have received a response from your provider.  Your safety is important to [place].  If you have drug allergies check your prescription carefully.    You can use MyChart to ask questions about today's visit, request a non-urgent call back, or ask for a work or school excuse for 24 hours related to this e-Visit. If it has been greater than 24 hours you will need to follow up with your provider, or enter a new e-Visit to address those concerns.  You will get an e-mail in the next two days asking about your experience.  I hope that your  e-visit has been valuable and will speed your recovery. Thank you for using e-visits.  I have spent 5 minutes in review of e-visit questionnaire, review and updating patient chart, medical decision making and response to patient.   Margaretann Loveless, PA-C

## 2023-07-09 DIAGNOSIS — B078 Other viral warts: Secondary | ICD-10-CM | POA: Diagnosis not present

## 2023-07-09 DIAGNOSIS — L308 Other specified dermatitis: Secondary | ICD-10-CM | POA: Diagnosis not present

## 2023-07-09 DIAGNOSIS — D225 Melanocytic nevi of trunk: Secondary | ICD-10-CM | POA: Diagnosis not present

## 2023-07-09 DIAGNOSIS — Z1283 Encounter for screening for malignant neoplasm of skin: Secondary | ICD-10-CM | POA: Diagnosis not present

## 2023-08-01 DIAGNOSIS — E8809 Other disorders of plasma-protein metabolism, not elsewhere classified: Secondary | ICD-10-CM | POA: Diagnosis not present

## 2023-09-24 DIAGNOSIS — N814 Uterovaginal prolapse, unspecified: Secondary | ICD-10-CM | POA: Diagnosis not present

## 2023-12-06 ENCOUNTER — Other Ambulatory Visit: Payer: Self-pay | Admitting: Unknown Physician Specialty

## 2023-12-06 DIAGNOSIS — Z1231 Encounter for screening mammogram for malignant neoplasm of breast: Secondary | ICD-10-CM

## 2024-01-21 ENCOUNTER — Other Ambulatory Visit (HOSPITAL_BASED_OUTPATIENT_CLINIC_OR_DEPARTMENT_OTHER): Payer: Self-pay

## 2024-01-21 MED ORDER — FLUZONE 0.5 ML IM SUSY
0.5000 mL | PREFILLED_SYRINGE | Freq: Once | INTRAMUSCULAR | 0 refills | Status: AC
  Filled 2024-01-21: qty 0.5, 1d supply, fill #0

## 2024-02-02 ENCOUNTER — Telehealth: Admitting: Emergency Medicine

## 2024-02-02 DIAGNOSIS — N898 Other specified noninflammatory disorders of vagina: Secondary | ICD-10-CM

## 2024-02-02 NOTE — Progress Notes (Signed)
 Because it seems like this yeast infection will not go away with standard treatment, I feel your condition warrants further evaluation and I recommend that you be seen for a face to face visit.  Please contact your primary care physician practice to be seen. Sometimes in cases like this, testing of the discharge is needed to figure out the proper treatment for it. I am sorry I cannot help you today by Evisit.    NOTE: You will NOT be charged for this eVisit.  If you do not have a PCP, [facility] offers a free physician referral service available at [phone]. Our trained staff has the experience, knowledge and resources to put you in touch with a physician who is right for you.    If you are having a true medical emergency please call 911.  For an urgent face to face visit, [facility] has multiple urgent care centers for your convenience.  Click the link below for the full list of locations and hours, walk-in wait times, appointment scheduling options and driving directions:  Urgent Care - [place], [place], [place], [place], [place], [place]  [facility]    Your e-visit answers were reviewed by a board certified advanced clinical practitioner to complete your personal care plan.  Thank you for using e-Visits.

## 2024-02-07 ENCOUNTER — Ambulatory Visit
Admission: RE | Admit: 2024-02-07 | Discharge: 2024-02-07 | Disposition: A | Discharge status: Home / Self Care | Source: Ambulatory Visit | Attending: Unknown Physician Specialty | Admitting: Unknown Physician Specialty

## 2024-02-07 DIAGNOSIS — Z1231 Encounter for screening mammogram for malignant neoplasm of breast: Secondary | ICD-10-CM | POA: Diagnosis not present

## 2024-04-18 ENCOUNTER — Encounter: Payer: Self-pay | Admitting: Physician Assistant

## 2024-05-14 ENCOUNTER — Ambulatory Visit: Admitting: Physician Assistant
